# Patient Record
Sex: Female | Born: 1975 | Race: White | Hispanic: No | Marital: Married | Smoking: Never smoker
Health system: Southern US, Community
[De-identification: ages and names within clinical notes are randomized; demographics above are authoritative.]

## PROBLEM LIST (undated history)

## (undated) DIAGNOSIS — F988 Other specified behavioral and emotional disorders with onset usually occurring in childhood and adolescence: Secondary | ICD-10-CM

## (undated) DIAGNOSIS — D509 Iron deficiency anemia, unspecified: Secondary | ICD-10-CM

## (undated) HISTORY — DX: Iron deficiency anemia, unspecified: D50.9

## (undated) HISTORY — PX: BREAST SURGERY: SHX581

## (undated) HISTORY — PX: OTHER SURGICAL HISTORY: SHX169

---

## 2002-10-19 ENCOUNTER — Other Ambulatory Visit: Admission: RE | Admit: 2002-10-19 | Discharge: 2002-10-19 | Payer: Self-pay | Admitting: Obstetrics and Gynecology

## 2002-12-15 ENCOUNTER — Inpatient Hospital Stay (HOSPITAL_COMMUNITY): Admission: AD | Admit: 2002-12-15 | Discharge: 2002-12-16 | Payer: Self-pay | Admitting: Obstetrics and Gynecology

## 2003-07-29 ENCOUNTER — Inpatient Hospital Stay (HOSPITAL_COMMUNITY): Admission: AD | Admit: 2003-07-29 | Discharge: 2003-07-30 | Payer: Self-pay | Admitting: Obstetrics and Gynecology

## 2004-04-18 ENCOUNTER — Other Ambulatory Visit: Admission: RE | Admit: 2004-04-18 | Discharge: 2004-04-18 | Payer: Self-pay | Admitting: Obstetrics and Gynecology

## 2009-08-05 ENCOUNTER — Emergency Department (HOSPITAL_BASED_OUTPATIENT_CLINIC_OR_DEPARTMENT_OTHER): Admission: EM | Admit: 2009-08-05 | Discharge: 2009-08-05 | Payer: Self-pay | Admitting: Emergency Medicine

## 2010-02-04 ENCOUNTER — Ambulatory Visit: Payer: Self-pay | Admitting: Internal Medicine

## 2010-02-13 LAB — CBC & DIFF AND RETIC
Basophils Absolute: 0.1 10*3/uL (ref 0.0–0.1)
Eosinophils Absolute: 0.1 10*3/uL (ref 0.0–0.5)
HCT: 29.8 % — ABNORMAL LOW (ref 34.8–46.6)
HGB: 8.3 g/dL — ABNORMAL LOW (ref 11.6–15.9)
Immature Retic Fract: 11.3 % — ABNORMAL HIGH (ref 0.00–10.70)
LYMPH%: 27.6 % (ref 14.0–49.7)
MONO#: 0.5 10*3/uL (ref 0.1–0.9)
NEUT%: 62.7 % (ref 38.4–76.8)
Platelets: 268 10*3/uL (ref 145–400)
Retic Ct Abs: 30.22 10*3/uL (ref 18.30–72.70)
WBC: 7.2 10*3/uL (ref 3.9–10.3)
lymph#: 2 10*3/uL (ref 0.9–3.3)

## 2010-02-14 LAB — COMPREHENSIVE METABOLIC PANEL
ALT: 10 U/L (ref 0–35)
AST: 12 U/L (ref 0–37)
Albumin: 4.4 g/dL (ref 3.5–5.2)
CO2: 26 mEq/L (ref 19–32)
Calcium: 8.9 mg/dL (ref 8.4–10.5)
Chloride: 102 mEq/L (ref 96–112)
Creatinine, Ser: 0.74 mg/dL (ref 0.40–1.20)
Potassium: 3.9 mEq/L (ref 3.5–5.3)
Sodium: 138 mEq/L (ref 135–145)
Total Protein: 6.9 g/dL (ref 6.0–8.3)

## 2010-02-14 LAB — VITAMIN B12: Vitamin B-12: 307 pg/mL (ref 211–911)

## 2010-02-14 LAB — IRON AND TIBC: TIBC: 246 ug/dL — ABNORMAL LOW (ref 250–470)

## 2010-02-14 LAB — FOLATE RBC: RBC Folate: 824 ng/mL — ABNORMAL HIGH (ref 180–600)

## 2010-05-20 LAB — URINALYSIS, ROUTINE W REFLEX MICROSCOPIC
Hgb urine dipstick: NEGATIVE
Nitrite: NEGATIVE
Protein, ur: NEGATIVE mg/dL
Urobilinogen, UA: 0.2 mg/dL (ref 0.0–1.0)

## 2010-05-20 LAB — WET PREP, GENITAL
Trich, Wet Prep: NONE SEEN
Yeast Wet Prep HPF POC: NONE SEEN

## 2010-05-20 LAB — GC/CHLAMYDIA PROBE AMP, GENITAL: GC Probe Amp, Genital: NEGATIVE

## 2010-05-20 LAB — PREGNANCY, URINE: Preg Test, Ur: NEGATIVE

## 2010-05-20 LAB — HERPES SIMPLEX VIRUS CULTURE

## 2010-08-13 ENCOUNTER — Emergency Department (HOSPITAL_BASED_OUTPATIENT_CLINIC_OR_DEPARTMENT_OTHER)
Admission: EM | Admit: 2010-08-13 | Discharge: 2010-08-13 | Disposition: A | Discharge status: Home / Self Care | Payer: Medicaid Other | Attending: Emergency Medicine | Admitting: Emergency Medicine

## 2010-08-13 DIAGNOSIS — R109 Unspecified abdominal pain: Secondary | ICD-10-CM | POA: Insufficient documentation

## 2010-08-13 DIAGNOSIS — R112 Nausea with vomiting, unspecified: Secondary | ICD-10-CM | POA: Insufficient documentation

## 2010-08-13 LAB — URINALYSIS, ROUTINE W REFLEX MICROSCOPIC
Bilirubin Urine: NEGATIVE
Glucose, UA: NEGATIVE mg/dL
Hgb urine dipstick: NEGATIVE
Ketones, ur: NEGATIVE mg/dL
Leukocytes, UA: NEGATIVE
Protein, ur: NEGATIVE mg/dL
pH: 8.5 — ABNORMAL HIGH (ref 5.0–8.0)

## 2010-08-13 LAB — COMPREHENSIVE METABOLIC PANEL
Alkaline Phosphatase: 70 U/L (ref 39–117)
BUN: 17 mg/dL (ref 6–23)
CO2: 24 mEq/L (ref 19–32)
Chloride: 98 mEq/L (ref 96–112)
Creatinine, Ser: 0.6 mg/dL (ref 0.4–1.2)
GFR calc non Af Amer: >60 mL/min (ref >60)
Glucose, Bld: 97 mg/dL (ref 70–99)
Potassium: 3.7 mEq/L (ref 3.5–5.1)
Total Bilirubin: 0.7 mg/dL (ref 0.3–1.2)

## 2010-08-13 LAB — LIPASE, BLOOD: Lipase: 30 U/L (ref 11–59)

## 2010-08-13 LAB — DIFFERENTIAL
Basophils Relative: 0 % (ref 0–1)
Lymphs Abs: 0.5 10*3/uL — ABNORMAL LOW (ref 0.7–4.0)
Monocytes Absolute: 0.8 10*3/uL (ref 0.1–1.0)
Monocytes Relative: 5 % (ref 3–12)
Neutro Abs: 13.6 10*3/uL — ABNORMAL HIGH (ref 1.7–7.7)

## 2010-08-13 LAB — CBC
Hemoglobin: 12.9 g/dL (ref 12.0–15.0)
MCH: 25.5 pg — ABNORMAL LOW (ref 26.0–34.0)
MCHC: 32.7 g/dL (ref 30.0–36.0)
MCV: 78.2 fL (ref 78.0–100.0)
RBC: 5.05 MIL/uL (ref 3.87–5.11)

## 2010-11-27 ENCOUNTER — Emergency Department (INDEPENDENT_AMBULATORY_CARE_PROVIDER_SITE_OTHER): Payer: Self-pay

## 2010-11-27 ENCOUNTER — Encounter: Payer: Self-pay | Admitting: Family Medicine

## 2010-11-27 ENCOUNTER — Emergency Department (HOSPITAL_BASED_OUTPATIENT_CLINIC_OR_DEPARTMENT_OTHER)
Admission: EM | Admit: 2010-11-27 | Discharge: 2010-11-27 | Disposition: A | Discharge status: Home / Self Care | Payer: Self-pay | Attending: Emergency Medicine | Admitting: Emergency Medicine

## 2010-11-27 DIAGNOSIS — H6690 Otitis media, unspecified, unspecified ear: Secondary | ICD-10-CM

## 2010-11-27 DIAGNOSIS — F988 Other specified behavioral and emotional disorders with onset usually occurring in childhood and adolescence: Secondary | ICD-10-CM | POA: Insufficient documentation

## 2010-11-27 DIAGNOSIS — R0602 Shortness of breath: Secondary | ICD-10-CM | POA: Insufficient documentation

## 2010-11-27 DIAGNOSIS — J40 Bronchitis, not specified as acute or chronic: Secondary | ICD-10-CM

## 2010-11-27 DIAGNOSIS — R0789 Other chest pain: Secondary | ICD-10-CM

## 2010-11-27 DIAGNOSIS — R05 Cough: Secondary | ICD-10-CM

## 2010-11-27 DIAGNOSIS — Z79899 Other long term (current) drug therapy: Secondary | ICD-10-CM | POA: Insufficient documentation

## 2010-11-27 HISTORY — DX: Other specified behavioral and emotional disorders with onset usually occurring in childhood and adolescence: F98.8

## 2010-11-27 MED ORDER — AZITHROMYCIN 250 MG PO TABS: ORAL_TABLET | ORAL | Status: DC

## 2010-11-27 MED ORDER — ALBUTEROL SULFATE HFA 108 (90 BASE) MCG/ACT IN AERS
2.0000 | INHALATION_SPRAY | Freq: Once | RESPIRATORY_TRACT | Status: AC
  Administered 2010-11-27: 2 via RESPIRATORY_TRACT
  Filled 2010-11-27: qty 6.7

## 2010-11-27 NOTE — ED Provider Notes (Signed)
History     CSN: [id number] Arrival date & time: 11/27/2010  5:05 PM  Chief Complaint  Patient presents with  . Shortness of Breath    (Consider location/radiation/quality/duration/timing/severity/associated sxs/prior treatment) HPI Comments: The patient reports that 2 weeks ago she developed some nasal congestion and severe sore throat and was seen at the urgent care who performed a strep test that was negative. After examination she was diagnosed with a upper respiratory infection along with a ear infection and sinus infection. She was given a cephalosporin antibiotic for treatment. She is now had improvement of her sore throat but has developed chest heaviness as well as a paroxysmal dry cough. She denies any wheezing or vomiting. She is now also taking Tussionex for the cough, Sudafed for the sinus congestion and prednisone. She did subsequently develop some ear pain and now reports that hearing sounds muffled. She had been told that her left ear was infected at her first urgent care visit. She denies history of smoking, asthma, COPD, pneumonia, bronchitis.  Patient is a 35 y.o. female presenting with shortness of breath. The history is provided by the patient.  Shortness of Breath  Associated symptoms include chest pain, sore throat, cough and shortness of breath. Pertinent negatives include no rhinorrhea.    Past Medical History  Diagnosis Date  . Attention deficit disorder (ADD)     History reviewed. No pertinent past surgical history.  No family history on file.  History  Substance Use Topics  . Smoking status: Never Smoker   . Smokeless tobacco: Not on file  . Alcohol Use: No    OB History    Grav Para Term Preterm Abortions TAB SAB Ect Mult Living                  Review of Systems  Constitutional: Negative.   HENT: Positive for ear pain, sore throat and ear discharge. Negative for rhinorrhea, postnasal drip and sinus pressure.   Respiratory: Positive for cough  and shortness of breath.   Cardiovascular: Positive for chest pain.  Gastrointestinal: Negative for nausea, vomiting, abdominal pain and diarrhea.    Allergies  Review of patient's allergies indicates no known allergies.  Home Medications   Current Outpatient Rx  Name Route Sig Dispense Refill  . AMPHETAMINE-DEXTROAMPHETAMINE 20 MG PO TABS Oral Take 20 mg by mouth 2 (two) times daily.      Marland Kitchen HYDROCODONE-ACETAMINOPHEN 7.5-325 MG/15ML PO SOLN Oral Take 5 mLs by mouth at bedtime.      Marland Kitchen NAPROXEN SODIUM 220 MG PO TABS Oral Take 660 mg by mouth 2 (two) times daily with a meal.      . NORETHIN-ETH ESTRAD-FE BIPHAS 1 MG-10 MCG / 10 MCG PO TABS Oral Take 1 tablet by mouth daily.      Marland Kitchen PREDNISONE 20 MG PO TABS Oral Take 40 mg by mouth daily.      Marland Kitchen PSEUDOEPHEDRINE HCL 30 MG PO TABS Oral Take 30 mg by mouth every 4 (four) hours as needed. Allergies        BP 104/71  Pulse 80  Temp(Src) 98.4 F (36.9 C) (Oral)  Resp 18  Ht 5\' 6"  (1.676 m)  Wt 125 lb (56.7 kg)  BMI 20.18 kg/m2  SpO2 100%  Physical Exam  Constitutional: She appears well-developed and well-nourished. No distress.  HENT:  Head: Normocephalic and atraumatic.  Right Ear: External ear normal. No mastoid tenderness. Tympanic membrane is retracted. Tympanic membrane is not injected. No decreased hearing is noted.  Left Ear: External ear normal. No mastoid tenderness. Tympanic membrane is injected.  No middle ear effusion. Decreased hearing is noted.  Nose: Nose normal.  Mouth/Throat: Uvula is midline and oropharynx is clear and moist.  Cardiovascular: Normal rate.   Pulmonary/Chest: Effort normal and breath sounds normal. She has no wheezes. She has no rhonchi.    ED Course  Procedures (including critical care time)  Labs Reviewed - No data to display No results found.   No diagnosis found.    MDM  CXR is negative for acute, reviewed by myself.  PT likely has mild bronchitis.  Will change to a zpak to treat  possible otitis as well as acute respiratory bacterial bronchitis, although I doubt.  She is non smoker, normal RA sats here of 100% and no abn lung sounds and normal CXR.  Otherwise she can follow up with her own PCP.  She can otherwise continue her other home medications except the cephalosporin.          Gavin Pound. Sofia Jaquith, MD 11/27/10 1806

## 2010-11-27 NOTE — ED Notes (Signed)
Pt c/o feeling short of breath and "like I'm breathing through fluid". Pt reports low fever. Pt also c/o "chest feeling heavy and my ears are full". Pt able to speak in full sentences.

## 2010-11-27 NOTE — Discharge Instructions (Signed)
 I think it is safe to continue your current medications.  Use the inhaler 2 puffs every 6 hours as needed for chest tightness, shortness of breath, coughing or wheezing.  Change your antibiotics to the Z-Pak rather than your current antibiotic.  Follow up with your own physician next week.  Your chest xray was normal.

## 2010-11-27 NOTE — ED Notes (Signed)
Pt. Takes a deep breath with clear lung snds and no distress noted.

## 2011-01-22 ENCOUNTER — Other Ambulatory Visit: Payer: Self-pay | Admitting: *Deleted

## 2011-01-22 ENCOUNTER — Telehealth: Payer: Self-pay | Admitting: Internal Medicine

## 2011-01-22 DIAGNOSIS — D649 Anemia, unspecified: Secondary | ICD-10-CM

## 2011-01-22 NOTE — Telephone Encounter (Signed)
Talked to pt, she wants to schedule a Iron Treatment, informed pt that I will inform RN and we will call her

## 2011-01-22 NOTE — Progress Notes (Signed)
Pt called wanting to get an iron infusion, per Dr Donnald Garre, pt needs f/u with labwork.  Onc tx schedule filled out for iron study 2-3 days before MD visit in 1-2 weeks.  SLJ

## 2011-01-24 ENCOUNTER — Telehealth: Payer: Self-pay | Admitting: Internal Medicine

## 2011-01-24 NOTE — Telephone Encounter (Signed)
Called pt,left,message, regarding appt for 12/3 and 12/5th

## 2011-01-27 ENCOUNTER — Telehealth: Payer: Self-pay | Admitting: Internal Medicine

## 2011-01-27 NOTE — Telephone Encounter (Signed)
Pt called and requested appt sooner than next week . I left her a message stating that nest week was  the soonest she could be seen .

## 2011-02-03 ENCOUNTER — Other Ambulatory Visit (HOSPITAL_BASED_OUTPATIENT_CLINIC_OR_DEPARTMENT_OTHER): Payer: Self-pay | Admitting: Lab

## 2011-02-03 DIAGNOSIS — D649 Anemia, unspecified: Secondary | ICD-10-CM

## 2011-02-03 DIAGNOSIS — D509 Iron deficiency anemia, unspecified: Secondary | ICD-10-CM

## 2011-02-03 LAB — CBC WITH DIFFERENTIAL/PLATELET
BASO%: 0.6 % (ref 0.0–2.0)
EOS%: 0.9 % (ref 0.0–7.0)
HCT: 39.2 % (ref 34.8–46.6)
LYMPH%: 27.1 % (ref 14.0–49.7)
MCH: 26.2 pg (ref 25.1–34.0)
MCHC: 32.8 g/dL (ref 31.5–36.0)
MONO#: 0.5 10*3/uL (ref 0.1–0.9)
NEUT%: 64.9 % (ref 38.4–76.8)
RBC: 4.91 10*6/uL (ref 3.70–5.45)
WBC: 7 10*3/uL (ref 3.9–10.3)
lymph#: 1.9 10*3/uL (ref 0.9–3.3)

## 2011-02-03 LAB — IRON AND TIBC
%SAT: 12 % — ABNORMAL LOW (ref 20–55)
Iron: 65 ug/dL (ref 42–145)
TIBC: 549 ug/dL — ABNORMAL HIGH (ref 250–470)

## 2011-02-05 ENCOUNTER — Encounter: Payer: Self-pay | Admitting: Internal Medicine

## 2011-02-05 ENCOUNTER — Ambulatory Visit (HOSPITAL_BASED_OUTPATIENT_CLINIC_OR_DEPARTMENT_OTHER): Payer: Self-pay | Admitting: Internal Medicine

## 2011-02-05 VITALS — BP 124/76 | HR 85 | Temp 98.6°F | Ht 66.0 in | Wt 147.3 lb

## 2011-02-05 DIAGNOSIS — D509 Iron deficiency anemia, unspecified: Secondary | ICD-10-CM

## 2011-02-05 DIAGNOSIS — R5383 Other fatigue: Secondary | ICD-10-CM

## 2011-02-05 DIAGNOSIS — R21 Rash and other nonspecific skin eruption: Secondary | ICD-10-CM

## 2011-02-05 HISTORY — DX: Iron deficiency anemia, unspecified: D50.9

## 2011-02-05 NOTE — Progress Notes (Signed)
[facility] OFFICE PROGRESS NOTE  DIAGNOSIS: Iron deficiency anemia  PRIOR THERAPY: Feraheme infusion x1 given 02/14/2010.   CURRENT THERAPY: None  INTERVAL HISTORY: Sara Beltran 35 y.o. female returns to the clinic today for followup visit. The patient was seen previously on 02/13/2010 for evaluation of iron deficiency anemia. Her iron level was very low at that time. I recommended for the patient Feraheme infusion for 2 doses with followup in 2 months. She received only 1 dose, which is a significant improvement in her hemoglobin and hematocrit, but the patient was lost to followup since that time. Over the last few weeks, she has not been feeling well with a skin rash on her face as well as significant fatigue and weakness. She lost her job recently because of lack of concentration. The patient came today for evaluation and recommendation regarding her iron deficiency anemia. Repeat study on 02/03/2011 showed highly than was 65 with a saturation of 12%. Her ferritin level is low at 4. The patient denied having any significant weight loss or night sweats, has no dizzy spell, no chest pain or shortness of breath.  MEDICAL HISTORY: Past Medical History  Diagnosis Date  . Attention deficit disorder (ADD)   . Iron deficiency anemia 02/05/2011    ALLERGIES:   has no known allergies.  MEDICATIONS:  Current Outpatient Prescriptions  Medication Sig Dispense Refill  . amphetamine-dextroamphetamine (ADDERALL) 20 MG tablet Take 20 mg by mouth 2 (two) times daily.        . Norethindrone-Ethinyl Estradiol-Fe Biphas (LO LOESTRIN FE) 1 MG-10 MCG / 10 MCG tablet Take 1 tablet by mouth daily.        Marland Kitchen azithromycin (ZITHROMAX Z-PAK) 250 MG tablet Take 2 tablets PO on first day, then 1 tablet PO on days 2-5  6 tablet  0  . hydrocodone-acetaminophen (HYCET) 7.5-325 MG/15ML solution Take 5 mLs by mouth at bedtime.        . naproxen sodium (ANAPROX) 220 MG tablet Take 660 mg by mouth 2 (two)  times daily with a meal.        . predniSONE (DELTASONE) 20 MG tablet Take 40 mg by mouth daily.        . pseudoephedrine (SUDAFED) 30 MG tablet Take 30 mg by mouth every 4 (four) hours as needed. Allergies          REVIEW OF SYSTEMS:  A comprehensive review of systems was negative except for: Constitutional: positive for fatigue   PHYSICAL EXAMINATION: General appearance: alert, cooperative and no distress Head: Normocephalic, without obvious abnormality, atraumatic Neck: no adenopathy Lymph nodes: Cervical, supraclavicular, and axillary nodes normal. Resp: clear to auscultation bilaterally Cardio: regular rate and rhythm, S1, S2 normal, no murmur, click, rub or gallop GI: soft, non-tender; bowel sounds normal; no masses,  no organomegaly Extremities: extremities normal, atraumatic, no cyanosis or edema Neurologic: Alert and oriented X 3, normal strength and tone. Normal symmetric reflexes. Normal coordination and gait  ECOG PERFORMANCE STATUS: 1 - Symptomatic but completely ambulatory  Blood pressure 124/76, pulse 85, temperature 98.6 F (37 C), temperature source Oral, height 5\' 6"  (1.676 m), weight 147 lb 4.8 oz (66.815 kg).  LABORATORY DATA: Lab Results  Component Value Date   WBC 7.0 02/03/2011   HGB 12.9 02/03/2011   HCT 39.2 02/03/2011   MCV 79.9 02/03/2011   PLT 325 02/03/2011      Chemistry      Component Value Date/Time   NA 134* 08/13/2010 1506   K  3.7 08/13/2010 1506   CL 98 08/13/2010 1506   CO2 24 08/13/2010 1506   BUN 17 08/13/2010 1506   CREATININE 0.60 08/13/2010 1506      Component Value Date/Time   CALCIUM 9.3 08/13/2010 1506   ALKPHOS 70 08/13/2010 1506   AST 20 08/13/2010 1506   ALT 24 08/13/2010 1506   BILITOT 0.7 08/13/2010 1506      ASSESSMENT: This is a very pleasant 35 years old white female with iron deficiency. The patient was not compliant with her treatment secondary to lack of insurance coverage. I discussed the lab result with Sara Beltran  today.  PLAN: I recommended for her to resume treatment was iron infusion in the form of Feraheme. I will schedule her next dose next week. In the meantime I started the patient on Integra plus 1 capsule by mouth daily. She was given a sample from the clinic equivalent for 16 days. The patient would come back for followup visit in 2 months for reevaluation with repeat CBC and iron study.   All questions were answered. The patient knows to call the clinic with any problems, questions or concerns. We can certainly see the patient much sooner if necessary.

## 2011-02-11 ENCOUNTER — Ambulatory Visit (HOSPITAL_BASED_OUTPATIENT_CLINIC_OR_DEPARTMENT_OTHER): Payer: Self-pay

## 2011-02-11 ENCOUNTER — Other Ambulatory Visit: Payer: Self-pay | Admitting: *Deleted

## 2011-02-11 VITALS — BP 108/78 | HR 75 | Temp 97.8°F

## 2011-02-11 DIAGNOSIS — D509 Iron deficiency anemia, unspecified: Secondary | ICD-10-CM

## 2011-02-11 MED ORDER — SODIUM CHLORIDE 0.9 % IV SOLN
Freq: Once | INTRAVENOUS | Status: AC
  Administered 2011-02-11: 10:00:00 via INTRAVENOUS

## 2011-02-11 MED ORDER — SODIUM CHLORIDE 0.9 % IV SOLN
1020.0000 mg | Freq: Once | INTRAVENOUS | Status: AC
  Administered 2011-02-11: 1020 mg via INTRAVENOUS
  Filled 2011-02-11: qty 34

## 2011-02-12 ENCOUNTER — Ambulatory Visit: Payer: Self-pay

## 2011-02-13 ENCOUNTER — Ambulatory Visit: Payer: Self-pay

## 2011-02-14 ENCOUNTER — Ambulatory Visit: Payer: Self-pay

## 2011-02-18 ENCOUNTER — Ambulatory Visit (HOSPITAL_BASED_OUTPATIENT_CLINIC_OR_DEPARTMENT_OTHER): Payer: Self-pay

## 2011-02-18 VITALS — BP 114/65 | HR 83 | Temp 97.8°F

## 2011-02-18 DIAGNOSIS — D509 Iron deficiency anemia, unspecified: Secondary | ICD-10-CM

## 2011-02-18 MED ORDER — SODIUM CHLORIDE 0.9 % IV SOLN
1020.0000 mg | Freq: Once | INTRAVENOUS | Status: AC
  Administered 2011-02-18: 1020 mg via INTRAVENOUS
  Filled 2011-02-18: qty 34

## 2011-02-18 MED ORDER — SODIUM CHLORIDE 0.9 % IV SOLN
Freq: Once | INTRAVENOUS | Status: AC
  Administered 2011-02-18: 14:00:00 via INTRAVENOUS

## 2011-02-21 ENCOUNTER — Telehealth: Payer: Self-pay | Admitting: Internal Medicine

## 2011-02-21 NOTE — Telephone Encounter (Addendum)
I could not contact pt to tell her that her IV iron  appt is cancelled on Monday per pharmacy. I left a message with her father to have her call me.  1630-I contacted pt and told her to cancel her iron appointments 12/24 and first week in jan . I told her someone will call her with f/u appointment for jan. Pt voices understanding.

## 2011-02-24 ENCOUNTER — Other Ambulatory Visit: Payer: Self-pay | Admitting: *Deleted

## 2011-02-24 ENCOUNTER — Ambulatory Visit: Payer: Self-pay

## 2011-03-04 ENCOUNTER — Other Ambulatory Visit: Payer: Self-pay | Admitting: Internal Medicine

## 2011-03-05 ENCOUNTER — Ambulatory Visit: Payer: Self-pay

## 2011-03-10 ENCOUNTER — Telehealth: Payer: Self-pay | Admitting: Internal Medicine

## 2011-03-10 ENCOUNTER — Encounter: Payer: Self-pay | Admitting: Internal Medicine

## 2011-03-10 NOTE — Telephone Encounter (Signed)
Tried Recruitment consultant and work number. Voice mail full and I did not recognize name on work number. I tried to contact pt about her low energy and tell her she had the two doses Dr Donnald Garre ordered. I contacted her  Father and asked him to have her call me.

## 2011-03-11 ENCOUNTER — Telehealth: Payer: Self-pay | Admitting: Internal Medicine

## 2011-03-11 NOTE — Telephone Encounter (Signed)
Spoke to patient. I told her she had 2 doses of IV iron in December . She said she is still very tired  despite taking her Adderall. She says she is "worn out"  after taking her daughter to school .  She admits she has not taken her Integra as ordered. I told her she needs to take her Integra everyday and she said she would. She confirmed next appointment with Dr Donnald Garre.  She understands to call for worsening symptoms.

## 2011-04-08 ENCOUNTER — Ambulatory Visit (HOSPITAL_BASED_OUTPATIENT_CLINIC_OR_DEPARTMENT_OTHER): Payer: Self-pay | Admitting: Internal Medicine

## 2011-04-08 ENCOUNTER — Other Ambulatory Visit (HOSPITAL_BASED_OUTPATIENT_CLINIC_OR_DEPARTMENT_OTHER): Payer: Self-pay

## 2011-04-08 ENCOUNTER — Ambulatory Visit: Payer: Self-pay

## 2011-04-08 ENCOUNTER — Telehealth: Payer: Self-pay | Admitting: Internal Medicine

## 2011-04-08 VITALS — BP 117/70 | HR 76 | Temp 99.4°F | Ht 66.0 in | Wt 143.1 lb

## 2011-04-08 DIAGNOSIS — D509 Iron deficiency anemia, unspecified: Secondary | ICD-10-CM

## 2011-04-08 DIAGNOSIS — N390 Urinary tract infection, site not specified: Secondary | ICD-10-CM

## 2011-04-08 DIAGNOSIS — D649 Anemia, unspecified: Secondary | ICD-10-CM

## 2011-04-08 LAB — CBC WITH DIFFERENTIAL/PLATELET
Basophils Absolute: 0 10*3/uL (ref 0.0–0.1)
Eosinophils Absolute: 0.1 10*3/uL (ref 0.0–0.5)
HGB: 14.1 g/dL (ref 11.6–15.9)
MONO#: 0.4 10*3/uL (ref 0.1–0.9)
NEUT#: 5.6 10*3/uL (ref 1.5–6.5)
RBC: 4.61 10*6/uL (ref 3.70–5.45)
RDW: 18.2 % — ABNORMAL HIGH (ref 11.2–14.5)
WBC: 7.5 10*3/uL (ref 3.9–10.3)

## 2011-04-08 LAB — FERRITIN: Ferritin: 389 ng/mL — ABNORMAL HIGH (ref 10–291)

## 2011-04-08 LAB — COMPREHENSIVE METABOLIC PANEL
Albumin: 4 g/dL (ref 3.5–5.2)
BUN: 14 mg/dL (ref 6–23)
CO2: 27 mEq/L (ref 19–32)
Calcium: 9.2 mg/dL (ref 8.4–10.5)
Chloride: 101 mEq/L (ref 96–112)
Glucose, Bld: 101 mg/dL — ABNORMAL HIGH (ref 70–99)
Potassium: 3.3 mEq/L — ABNORMAL LOW (ref 3.5–5.3)
Sodium: 139 mEq/L (ref 135–145)
Total Protein: 7.1 g/dL (ref 6.0–8.3)

## 2011-04-08 LAB — IRON AND TIBC
TIBC: 345 ug/dL (ref 250–470)
UIBC: 213 ug/dL (ref 125–400)

## 2011-04-08 MED ORDER — CIPROFLOXACIN HCL 500 MG PO TABS: 500.0000 mg | ORAL_TABLET | Freq: Two times a day (BID) | ORAL | Status: AC

## 2011-04-08 NOTE — Progress Notes (Signed)
[facility] OFFICE PROGRESS NOTE  DIAGNOSIS: Iron deficiency anemia   PRIOR THERAPY: Feraheme infusion x3, last dose was given 02/18/2011.   CURRENT THERAPY: None   INTERVAL HISTORY: Sara Beltran 36 y.o. female returns to the clinic today for followup visit. The patient tolerated the last 2 intravenous infusion of Feraheme fairly well. She has improvement in her general condition, but still had some mild fatigue. She denied having any significant weight loss or night sweats. She has no chest pain or shortness of breath. She complained today of bad odor of her urine as well as inability to empty her bladder. She has repeat CBC on a study performed earlier today and she is here for evaluation and discussion of her lab results.  MEDICAL HISTORY: Past Medical History  Diagnosis Date  . Attention deficit disorder (ADD)   . Iron deficiency anemia 02/05/2011    ALLERGIES:   has no known allergies.  MEDICATIONS:  Current Outpatient Prescriptions  Medication Sig Dispense Refill  . amphetamine-dextroamphetamine (ADDERALL) 20 MG tablet Take 20 mg by mouth 2 (two) times daily.        . Norethindrone-Ethinyl Estradiol-Fe Biphas (LO LOESTRIN FE) 1 MG-10 MCG / 10 MCG tablet Take 1 tablet by mouth daily.        Marland Kitchen azithromycin (ZITHROMAX Z-PAK) 250 MG tablet Take 2 tablets PO on first day, then 1 tablet PO on days 2-5  6 tablet  0  . ciprofloxacin (CIPRO) 500 MG tablet Take 1 tablet (500 mg total) by mouth 2 (two) times daily.  10 tablet  0  . hydrocodone-acetaminophen (HYCET) 7.5-325 MG/15ML solution Take 5 mLs by mouth at bedtime.        . naproxen sodium (ANAPROX) 220 MG tablet Take 660 mg by mouth 2 (two) times daily with a meal.        . predniSONE (DELTASONE) 20 MG tablet Take 40 mg by mouth daily.        . pseudoephedrine (SUDAFED) 30 MG tablet Take 30 mg by mouth every 4 (four) hours as needed. Allergies          REVIEW OF SYSTEMS:  A comprehensive review of systems  was negative except for: Genitourinary: positive for decreased stream, hesitancy and bad odor   PHYSICAL EXAMINATION: General appearance: alert, cooperative and no distress Lymph nodes: Cervical, supraclavicular, and axillary nodes normal. Resp: clear to auscultation bilaterally Cardio: regular rate and rhythm, S1, S2 normal, no murmur, click, rub or gallop GI: soft, non-tender; bowel sounds normal; no masses,  no organomegaly Extremities: extremities normal, atraumatic, no cyanosis or edema  ECOG PERFORMANCE STATUS: 0 - Asymptomatic  Blood pressure 117/70, pulse 76, temperature 99.4 F (37.4 C), temperature source Oral, height 5\' 6"  (1.676 m), weight 143 lb 1.6 oz (64.91 kg).  LABORATORY DATA: Lab Results  Component Value Date   WBC 7.5 04/08/2011   HGB 14.1 04/08/2011   HCT 41.0 04/08/2011   MCV 88.8 04/08/2011   PLT 268 04/08/2011      Chemistry      Component Value Date/Time   NA 139 04/08/2011 1500   K 3.3* 04/08/2011 1500   CL 101 04/08/2011 1500   CO2 27 04/08/2011 1500   BUN 14 04/08/2011 1500   CREATININE 0.87 04/08/2011 1500      Component Value Date/Time   CALCIUM 9.2 04/08/2011 1500   ALKPHOS 48 04/08/2011 1500   AST 14 04/08/2011 1500   ALT 16 04/08/2011 1500  BILITOT 0.7 04/08/2011 1500       RADIOGRAPHIC STUDIES: No results found.  ASSESSMENT: This is a very pleasant 36 years old white female with iron deficiency anemia status post Feraheme infusion with significant improvement in her hemoglobin and hematocrit. The iron study still pending. She also has symptoms of urinary tract infection. I discussed the lab result with the patient today.  PLAN:  #1 I will continue the patient on observation for now with repeat CBC and a study in 3 months for evaluation of her iron deficiency anemia. #2 I will check urinalysis today with culture sensitivity. I started the patient empirically on Cipro 500 mg by mouth twice a day for 5 days. The patient was advised to call me immediately if she has  any concerning symptoms in the interval.   All questions were answered. The patient knows to call the clinic with any problems, questions or concerns. We can certainly see the patient much sooner if necessary.

## 2011-04-08 NOTE — Telephone Encounter (Signed)
sent pt to labs.  gv appt for [id number]

## 2011-04-10 LAB — URINE CULTURE

## 2011-04-21 ENCOUNTER — Telehealth: Payer: Self-pay

## 2011-04-21 NOTE — Telephone Encounter (Signed)
.  [facility] PT IN NEED OF HER ADDERALL. PLEASE CALL [phone] WHEN READY FOR P/U

## 2011-04-22 ENCOUNTER — Other Ambulatory Visit: Payer: Self-pay | Admitting: Physician Assistant

## 2011-04-22 MED ORDER — AMPHETAMINE-DEXTROAMPHETAMINE 20 MG PO TABS: 20.0000 mg | ORAL_TABLET | Freq: Two times a day (BID) | ORAL | Status: DC

## 2011-04-23 ENCOUNTER — Telehealth: Payer: Self-pay

## 2011-04-23 NOTE — Telephone Encounter (Signed)
LMOM that Adderall Rx is ready for p/up

## 2011-05-23 ENCOUNTER — Telehealth: Payer: Self-pay

## 2011-05-23 NOTE — Telephone Encounter (Signed)
PT IS REQUESTING ADDERALL 20 MG 2 TIMES DAILY  CALL [phone]

## 2011-05-24 MED ORDER — AMPHETAMINE-DEXTROAMPHETAMINE 20 MG PO TABS: 20.0000 mg | ORAL_TABLET | Freq: Two times a day (BID) | ORAL | Status: DC

## 2011-05-24 NOTE — Telephone Encounter (Signed)
Singed at [employer] - will need ov next month before more RX are written.

## 2011-05-24 NOTE — Telephone Encounter (Signed)
ADVISED PT THAT RX IS READY FOR PICKUP 

## 2011-06-24 ENCOUNTER — Telehealth: Payer: Self-pay

## 2011-06-24 MED ORDER — AMPHETAMINE-DEXTROAMPHETAMINE 20 MG PO TABS: 20.0000 mg | ORAL_TABLET | Freq: Two times a day (BID) | ORAL | Status: DC

## 2011-06-24 NOTE — Telephone Encounter (Signed)
Chart pulled to PA 

## 2011-06-24 NOTE — Telephone Encounter (Signed)
Please pull chart.  Sara Beltran 

## 2011-06-24 NOTE — Telephone Encounter (Signed)
PT WOULD LIKE REFILL ON ADDERALL 20MG  2X DAILY.  CALL WHEN READY FOR PICKUP

## 2011-06-24 NOTE — Telephone Encounter (Signed)
Done and ready for pick up.  Sara Beltran

## 2011-06-24 NOTE — Telephone Encounter (Signed)
Spoke with pt advised RX ready to be picked up. 

## 2011-07-08 ENCOUNTER — Other Ambulatory Visit: Payer: Self-pay | Admitting: Lab

## 2011-07-08 ENCOUNTER — Ambulatory Visit: Payer: Self-pay | Admitting: Internal Medicine

## 2011-07-29 ENCOUNTER — Telehealth: Payer: Self-pay

## 2011-07-29 MED ORDER — AMPHETAMINE-DEXTROAMPHETAMINE 20 MG PO TABS: 20.0000 mg | ORAL_TABLET | Freq: Two times a day (BID) | ORAL | Status: DC

## 2011-07-29 NOTE — Telephone Encounter (Signed)
PT REQUESTING ADDERALL REFILL  BEST PHONE [phone]

## 2011-07-29 NOTE — Telephone Encounter (Signed)
Addended by: Fernande Bras on: 07/29/2011 09:24 PM   Modules accepted: Orders

## 2011-07-29 NOTE — Telephone Encounter (Signed)
Rx printed.  Please advise patient that she is overdue for follow-up.  She needs an OV for her next fill.

## 2011-07-30 NOTE — Telephone Encounter (Signed)
LMOM to call back

## 2011-07-30 NOTE — Telephone Encounter (Signed)
Rx is in drawer for p/up. Please let pt know about need for OV

## 2011-07-31 NOTE — Telephone Encounter (Signed)
Notified pt of Rx and need for OV. Pt agreed

## 2011-10-14 ENCOUNTER — Ambulatory Visit: Payer: Self-pay | Admitting: Family Medicine

## 2011-10-14 VITALS — BP 118/76 | HR 88 | Temp 98.5°F | Resp 16 | Ht 65.5 in | Wt 141.8 lb

## 2011-10-14 DIAGNOSIS — F909 Attention-deficit hyperactivity disorder, unspecified type: Secondary | ICD-10-CM

## 2011-10-14 DIAGNOSIS — N760 Acute vaginitis: Secondary | ICD-10-CM

## 2011-10-14 LAB — POCT WET PREP WITH KOH
KOH Prep POC: NEGATIVE
Trichomonas, UA: NEGATIVE

## 2011-10-14 MED ORDER — AMPHETAMINE-DEXTROAMPHETAMINE 20 MG PO TABS: 20.0000 mg | ORAL_TABLET | Freq: Two times a day (BID) | ORAL | Status: DC

## 2011-10-14 MED ORDER — METRONIDAZOLE 500 MG PO TABS: 500.0000 mg | ORAL_TABLET | Freq: Two times a day (BID) | ORAL | Status: AC

## 2011-10-14 MED ORDER — NORETHIN-ETH ESTRAD-FE BIPHAS 1 MG-10 MCG / 10 MCG PO TABS: 1.0000 | ORAL_TABLET | Freq: Every day | ORAL | Status: DC

## 2011-10-14 MED ORDER — FLUCONAZOLE 150 MG PO TABS: 150.0000 mg | ORAL_TABLET | Freq: Once | ORAL | Status: AC

## 2011-10-14 NOTE — Patient Instructions (Addendum)
1. Vaginitis and vulvovaginitis  POCT Wet Prep with KOH, fluconazole (DIFLUCAN) 150 MG tablet  2. ADHD (attention deficit hyperactivity disorder)  amphetamine-dextroamphetamine (ADDERALL) 20 MG tablet     Take Diflucan today; if still having vaginal itching in one week, recommend filling Metronidazole for bacterial vaginitis.  Refill of Adderall and birth control provided at visit.  Will need repeat visit/follow up visit for ADD in six months.

## 2011-10-14 NOTE — Progress Notes (Signed)
Subjective:    Patient ID: Sara Beltran, female    DOB: May 10, 1975, 36 y.o.   MRN: [id number]  HPIThis 36 y.o. female presents for evaluation of yeast infection/vaginal itching.  Onset with vaginal itching for one day.  No vaginal discharge.  No redness; no sores in vaginal area.  No dysuria. +sexual activity; no new partners; current partner x 3 months.  HSV-I outbreak.  Previous STD screening; followed by Oncology/Hematology center for anemia.  Last pap smear 11/2010 normal.  Finished Amoxicillin yesterday for UTI symptoms which has improved.  No fever/chills/sweats.  No urgency or nocturia.  ADHD:  Due for refill for ADD.  Medication working well.  Will split pills and adjust dose.  Will take 1/2 in am and 1/2 four hours later.  XR did not work so splitting pills works best; current dose for two years.  Previously took 30mg  in past; since at this location, taking 20mg  tablets. 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  No side effects to medication; some insomnia intermittently; no heart palpitations; no headaches.   Last phone message requesting refill 07/31/11; OV required for further refills.  Last OV for ADD follow-up 11/2010.  Currently self pay; not taking medication daily due to expense.  Diagnosed with ADHD in college.     Past Medical History  Diagnosis Date  . Attention deficit disorder (ADD)   . Iron deficiency anemia 02/05/2011    No past surgical history on file.  Prior to Admission medications   Medication Sig Start Date End Date Taking? Authorizing Provider  amphetamine-dextroamphetamine (ADDERALL) 20 MG tablet Take 1 tablet (20 mg total) by mouth 2 (two) times daily. 07/29/11  Yes Chelle S Jeffery, PA-C  Norethindrone-Ethinyl Estradiol-Fe Biphas (LO LOESTRIN FE) 1 MG-10 MCG / 10 MCG tablet Take 1 tablet by mouth daily.     Yes Historical Provider, MD    No Known Allergies  History   Social History  . Marital Status: Divorced    Spouse Name: N/A   Number of Children: N/A  . Years of Education: N/A   Occupational History  . Not on file.   Social History Main Topics  . Smoking status: Never Smoker   . Smokeless tobacco: Not on file  . Alcohol Use: No  . Drug Use: No  . Sexually Active:    Other Topics Concern  . Not on file   Social History Narrative  . No narrative on file    No family history on file.    Review of Systems  Constitutional: Negative for fever, diaphoresis and fatigue.  Respiratory: Negative for shortness of breath and wheezing.   Cardiovascular: Negative for chest pain and palpitations.  Gastrointestinal: Negative for nausea, vomiting, abdominal pain and abdominal distention.  Genitourinary: Negative for dysuria, urgency, hematuria, flank pain, vaginal bleeding, vaginal discharge, genital sores, vaginal pain, menstrual problem and pelvic pain.       +VAGINAL ITCHING WITHOUT VAGINAL DISCHARGE; NO VAGINAL LESIONS.  Psychiatric/Behavioral: Positive for decreased concentration. Negative for hallucinations, behavioral problems, confusion, disturbed wake/sleep cycle, self-injury, dysphoric mood and agitation. The patient is not nervous/anxious.        Objective:   Physical Exam  Nursing note and vitals reviewed. Constitutional: She is oriented to person, place, and time. She appears well-developed and well-nourished.  Eyes: Conjunctivae are normal. Pupils are equal, round, and reactive to light.  Neck: Normal range of motion. Neck supple. No thyromegaly present.  Cardiovascular: Normal rate, regular rhythm and normal heart sounds.  Exam reveals no gallop and no friction rub.   No murmur heard. Pulmonary/Chest: Effort normal and breath sounds normal.  Abdominal: Soft. Bowel sounds are normal. She exhibits no distension and no mass. There is no tenderness. There is no rebound and no guarding.  Genitourinary: Uterus normal. Vaginal discharge found.       +SCANT VAGINAL DISCHARGE YELLOW-WHITE PRESENT IN  VAULT; NO CMT; NO ADNEXAL MASSES.  Neurological: She is alert and oriented to person, place, and time. No cranial nerve deficit. She exhibits normal muscle tone. Coordination normal.  Psychiatric: She has a normal mood and affect. Her behavior is normal. Judgment and thought content normal.   GU: scant white yellow vaginal discharge; mild vaginal erythema.  NO CMT.  Normal cervix without lesions.  Results for orders placed in visit on 10/14/11  POCT WET PREP WITH KOH      Component Value Range   Trichomonas, UA Negative     Clue Cells Wet Prep HPF POC 100%     Epithelial Wet Prep HPF POC 3-5     Yeast Wet Prep HPF POC neg     Bacteria Wet Prep HPF POC 1+     RBC Wet Prep HPF POC 0-2     WBC Wet Prep HPF POC 2-5     KOH Prep POC Negative            Assessment & Plan:    1. Vaginitis and vulvovaginitis  POCT Wet Prep with KOH, fluconazole (DIFLUCAN) 150 MG tablet, metroNIDAZOLE (FLAGYL) 500 MG tablet  2. ADHD (attention deficit hyperactivity disorder)  amphetamine-dextroamphetamine (ADDERALL) 20 MG tablet   1.  Vaginitis:  New. Onset 24 hours ago after completing Amoxicillin therapy. Wet prep negative for yeast.  Will empirically treat with Diflucan; if no improvement in one week after taking Diflucan, rx for Metronidazole provided to take for BV. Pt expressed understanding of instructions.  Declined STD screening today.  2. ADD: stable; refill x 1 month provided of Adderall 20mg  bid; advised of policy to call monthly for refills; not followed at clinic by regular PCP.  No change in dose.  Recommend follow-up in six months due to lack of insurance.  3. Contraception management: pt requested refill of OCP at end of visit and provided RF.

## 2011-10-24 NOTE — Progress Notes (Signed)
Reviewed and agree.

## 2011-11-17 ENCOUNTER — Telehealth: Payer: Self-pay

## 2011-11-17 DIAGNOSIS — F909 Attention-deficit hyperactivity disorder, unspecified type: Secondary | ICD-10-CM

## 2011-11-17 NOTE — Telephone Encounter (Signed)
Ok x 5 months  

## 2011-11-17 NOTE — Telephone Encounter (Signed)
REQUESTING REFILL ON amphetamine-dextroamphetamine (ADDERALL) 20 MG tablet  CALL [phone]

## 2011-11-18 MED ORDER — AMPHETAMINE-DEXTROAMPHETAMINE 20 MG PO TABS: 20.0000 mg | ORAL_TABLET | Freq: Two times a day (BID) | ORAL | Status: DC

## 2011-11-18 NOTE — Telephone Encounter (Signed)
Called patient to advise.  Left message

## 2011-11-18 NOTE — Telephone Encounter (Signed)
Medication refilled and printed.  

## 2011-12-20 ENCOUNTER — Other Ambulatory Visit: Payer: Self-pay | Admitting: Physician Assistant

## 2011-12-20 NOTE — Telephone Encounter (Signed)
Please see phone message attached to refill request. Please submit Adderall refill request in correct location so we have documentation. Please call pharmacy about cheaper OCP.

## 2011-12-20 NOTE — Telephone Encounter (Signed)
Pt states ago she was in office and went to pharmacy to try and pick up her birth control and pharmacy states she needs to contact us to change her bc to a cheaper brand microgetine. Please contact pharmacy if any questions. Pt [phone] pt also needs rx refill for adderall please call when ready for pick -up

## 2011-12-21 ENCOUNTER — Telehealth: Payer: Self-pay | Admitting: *Deleted

## 2011-12-21 DIAGNOSIS — F909 Attention-deficit hyperactivity disorder, unspecified type: Secondary | ICD-10-CM

## 2011-12-21 MED ORDER — AMPHETAMINE-DEXTROAMPHETAMINE 20 MG PO TABS: 20.0000 mg | ORAL_TABLET | Freq: Two times a day (BID) | ORAL | Status: DC

## 2011-12-21 NOTE — Telephone Encounter (Signed)
Sara Beltran 12/20/2011 1:34 PM Signed  Please see phone message attached to refill request. Please submit Adderall refill request in correct location so we have documentation. Please call pharmacy about cheaper OCP.  Lauretta Grill 12/20/2011 1:16 PM Signed  Pt states ago she was in office and went to pharmacy to try and pick up her birth control and pharmacy states she needs to contact us to change her bc to a cheaper brand microgetine. Please contact pharmacy if any questions. Pt [phone] pt also needs rx refill for adderall please call when ready for pick -up

## 2011-12-21 NOTE — Telephone Encounter (Signed)
Pt notified that rx is ready for pickup and that other rx was sent in

## 2011-12-21 NOTE — Telephone Encounter (Signed)
Adderall Rx printed and at the TL desk.

## 2012-01-20 ENCOUNTER — Telehealth: Payer: Self-pay

## 2012-01-20 DIAGNOSIS — F909 Attention-deficit hyperactivity disorder, unspecified type: Secondary | ICD-10-CM

## 2012-01-20 MED ORDER — AMPHETAMINE-DEXTROAMPHETAMINE 20 MG PO TABS: 20.0000 mg | ORAL_TABLET | Freq: Two times a day (BID) | ORAL | Status: DC

## 2012-01-20 NOTE — Telephone Encounter (Signed)
Refilled

## 2012-01-20 NOTE — Telephone Encounter (Signed)
Spoke with pt advised  Rx ready to pick up. 

## 2012-01-20 NOTE — Telephone Encounter (Signed)
Patient request Adderall rx refill. [phone].

## 2012-02-17 ENCOUNTER — Telehealth: Payer: Self-pay

## 2012-02-17 DIAGNOSIS — F909 Attention-deficit hyperactivity disorder, unspecified type: Secondary | ICD-10-CM

## 2012-02-17 NOTE — Telephone Encounter (Signed)
Pt is requesting a refill on adderall. States may be a little early, but she is going out of town

## 2012-02-19 MED ORDER — AMPHETAMINE-DEXTROAMPHETAMINE 20 MG PO TABS: 20.0000 mg | ORAL_TABLET | Freq: Two times a day (BID) | ORAL | Status: DC

## 2012-02-19 NOTE — Telephone Encounter (Signed)
Rx ready for pick up.  Please remind patient that she will be due for office follow-up in 04/2012.  KMS

## 2012-02-19 NOTE — Telephone Encounter (Signed)
Called patient to advise  °

## 2012-03-27 ENCOUNTER — Telehealth: Payer: Self-pay

## 2012-03-27 NOTE — Telephone Encounter (Signed)
PATIENT WOULD LIKE ADDERALL RX. BEST # [phone]

## 2012-03-29 ENCOUNTER — Other Ambulatory Visit: Payer: Self-pay

## 2012-03-29 DIAGNOSIS — F909 Attention-deficit hyperactivity disorder, unspecified type: Secondary | ICD-10-CM

## 2012-03-29 NOTE — Telephone Encounter (Signed)
Pt is requesting a refill on adderall. States may be a little early, but she is going out of town. PATIENT NEEDS NIECE TO PICK IT UP FOR HER: JODIE BRYANT. [phone].

## 2012-03-30 MED ORDER — AMPHETAMINE-DEXTROAMPHETAMINE 20 MG PO TABS: 20.0000 mg | ORAL_TABLET | Freq: Two times a day (BID) | ORAL | Status: DC

## 2012-03-30 NOTE — Telephone Encounter (Signed)
Call --- refill for Adderall ready for pick up.  Please advise pt that she will need appointment in 04/2012 for further refills; last office visit six months ago in 10/2011.  Also please advise pt of new controlled substance policy: [facility] Policy for Prescribing Controlled Substances (Revised 01/2012) 1. Prescriptions for controlled substances will be filled by ONE provider at [facility] with whom you have established and developed a plan for your care, including follow-up. 2. You are encouraged to schedule an appointment with your prescriber at our appointment center for follow-up visits whenever possible. 3. If you request a prescription for the controlled substance while at [facility] for an acute problem (with someone other than your regular prescriber), you MAY be given a ONE-TIME prescription for a 30-day supply of the controlled substance, to allow time for you to return to see your regular prescriber for additional prescriptions.  Patient will need to choose one provider to see every six months for refills of Adderall.  We also advise she make appointment at 104 for six month follow-ups.  If she does not have a specific provider, advise she make appointment with me in one month at 104 by appointment.  Thanks, [employer]

## 2012-03-30 NOTE — Telephone Encounter (Signed)
Please advise on Adderall Rx, pended Rx.

## 2012-03-30 NOTE — Addendum Note (Signed)
Addended byCaffie Damme on: 03/30/2012 09:22 AM   Modules accepted: Orders

## 2012-03-30 NOTE — Telephone Encounter (Signed)
PT NOTIFIED THAT RX WAS READY TO PICKUP

## 2012-03-30 NOTE — Telephone Encounter (Signed)
Call -- rx ready for pick up.  Please advise pt of current controlled substance policy.   [facility] Policy for Prescribing Controlled Substances (Revised 01/2012) 1. Prescriptions for controlled substances will be filled by ONE provider at [facility] with whom you have established and developed a plan for your care, including follow-up. 2. You are encouraged to schedule an appointment with your prescriber at our appointment center for follow-up visits whenever possible. 3. If you request a prescription for the controlled substance while at [facility] for an acute problem (with someone other than your regular prescriber), you MAY be given a ONE-TIME prescription for a 30-day supply of the controlled substance, to allow time for you to return to see your regular prescriber for additional prescriptions.  She will need appointment in 04/2012 for six month follow-up; please have her schedule OV with me next month at 104.  KMS

## 2012-04-01 NOTE — Telephone Encounter (Signed)
No answer/no VM. Rx is in drawer for p/up. I will put a copy of this note that she needs f/up and new policy w/her Rx in case she p/up bf we can reach her.

## 2012-04-02 NOTE — Telephone Encounter (Signed)
Tried to call pt again. VM full. We do not have permission per HIPPA to call pt at work.

## 2012-04-06 NOTE — Telephone Encounter (Signed)
Pt has p/up her RF and received note I put in envelope.

## 2013-01-08 ENCOUNTER — Other Ambulatory Visit: Payer: Self-pay | Admitting: Physician Assistant

## 2013-05-16 ENCOUNTER — Ambulatory Visit: Payer: Self-pay | Admitting: Emergency Medicine

## 2013-05-16 VITALS — BP 118/68 | HR 82 | Temp 98.2°F | Resp 16 | Ht 65.5 in | Wt 174.6 lb

## 2013-05-16 DIAGNOSIS — F909 Attention-deficit hyperactivity disorder, unspecified type: Secondary | ICD-10-CM

## 2013-05-16 DIAGNOSIS — F988 Other specified behavioral and emotional disorders with onset usually occurring in childhood and adolescence: Secondary | ICD-10-CM

## 2013-05-16 DIAGNOSIS — H66009 Acute suppurative otitis media without spontaneous rupture of ear drum, unspecified ear: Secondary | ICD-10-CM

## 2013-05-16 MED ORDER — AMOXICILLIN 500 MG PO CAPS: 500.0000 mg | ORAL_CAPSULE | Freq: Three times a day (TID) | ORAL | Status: DC

## 2013-05-16 MED ORDER — AMPHETAMINE-DEXTROAMPHETAMINE 20 MG PO TABS: 20.0000 mg | ORAL_TABLET | Freq: Two times a day (BID) | ORAL | Status: DC

## 2013-05-16 NOTE — Progress Notes (Signed)
[facility] [address], [place] [address] [phone]  Date:  05/16/2013   Name:  Sara Beltran   DOB:  May 29, 1975   MRN:  [id number]  PCP:  No primary provider on file.    Chief Complaint: Medication Refill and Ears stopped up   History of Present Illness:  Sara Beltran is a 38 y.o. very pleasant female patient who presents with the following:  Previously under treatment by Dr Hal Hopeichter for ADD and [place]  Was treated there by an [facility] and has resettled here again.  Requests refill on her medications.  Last dose in September.  Finds she is disorganized and unable to focus and accomplish tasks in office and it is interfering with her work. No improvement with over the counter medications or other home remedies. Says her ears are bothering her and interfering with her hearing left worse than right.  no coryza, fever, chills, cough.  Non smoker. Denies other complaint or health concern today.   Patient Active Problem List   Diagnosis Date Noted  . ADD (attention deficit disorder) 05/16/2013  . Iron deficiency anemia 02/05/2011    Past Medical History  Diagnosis Date  . Attention deficit disorder (ADD)   . Iron deficiency anemia 02/05/2011    History reviewed. No pertinent past surgical history.  History  Substance Use Topics  . Smoking status: Never Smoker   . Smokeless tobacco: Not on file  . Alcohol Use: No    History reviewed. No pertinent family history.  No Known Allergies  Medication list has been reviewed and updated.  Current Outpatient Prescriptions on File Prior to Visit  Medication Sig Dispense Refill  . amphetamine-dextroamphetamine (ADDERALL) 20 MG tablet Take 1 tablet (20 mg total) by mouth 2 (two) times daily.  60 tablet  0  . Norethindrone Acetate-Ethinyl Estradiol (MICROGESTIN) 1.5-30 MG-MCG tablet Take 1 tablet by mouth daily. PATIENT NEEDS OFFICE VISIT FOR ADDITIONAL REFILLS  28 tablet  0  .  Norethindrone-Ethinyl Estradiol-Fe Biphas (LO LOESTRIN FE) 1 MG-10 MCG / 10 MCG tablet Take 1 tablet by mouth daily.  1 Package  12   No current facility-administered medications on file prior to visit.    Review of Systems:  As per HPI, otherwise negative.    Physical Examination: Filed Vitals:   05/16/13 1257  BP: 118/68  Pulse: 82  Temp: 98.2 F (36.8 C)  Resp: 16   Filed Vitals:   05/16/13 1257  Height: 5' 5.5" (1.664 m)  Weight: 174 lb 9.6 oz (79.198 kg)   Body mass index is 28.6 kg/(m^2). Ideal Body Weight: Weight in (lb) to have BMI = 25: 152.2   GEN: WDWN, NAD, Non-toxic, Alert & Oriented x 3 HEENT: Atraumatic, Normocephalic.  Ears and Nose: No external deformity.  Left otitis media EXTR: No clubbing/cyanosis/edema NEURO: Normal gait.  PSYCH: Normally interactive. Conversant. Not depressed or anxious appearing.  Calm demeanor.    Assessment and Plan: Otitis media Amoxicillin ADD Amoxicillin   Signed,  Phillips OdorJeffery Bryleigh Ottaway, MD

## 2013-05-16 NOTE — Patient Instructions (Signed)
Otitis Media, Adult Otitis media is redness, soreness, and swelling (inflammation) of the middle ear. Otitis media may be caused by allergies or, most commonly, by infection. Often it occurs as a complication of the common cold. SIGNS AND SYMPTOMS Symptoms of otitis media may include:  Earache.  Fever.  Ringing in your ear.  Headache.  Leakage of fluid from the ear. DIAGNOSIS To diagnose otitis media, your health care provider will examine your ear with an otoscope. This is an instrument that allows your health care provider to see into your ear in order to examine your eardrum. Your health care provider also will ask you questions about your symptoms. TREATMENT  Typically, otitis media resolves on its own within 3 5 days. Your health care provider may prescribe medicine to ease your symptoms of pain. If otitis media does not resolve within 5 days or is recurrent, your health care provider may prescribe antibiotic medicines if he or she suspects that a bacterial infection is the cause. HOME CARE INSTRUCTIONS   Take your medicine as directed until it is gone, even if you feel better after the first few days.  Only take over-the-counter or prescription medicines for pain, discomfort, or fever as directed by your health care provider.  Follow up with your health care provider as directed. SEEK MEDICAL CARE IF:  You have otitis media only in one ear or bleeding from your nose or both.  You notice a lump on your neck.  You are not getting better in 3 5 days.  You feel worse instead of better. SEEK IMMEDIATE MEDICAL CARE IF:   You have pain that is not controlled with medicine.  You have swelling, redness, or pain around your ear or stiffness in your neck.  You notice that part of your face is paralyzed.  You notice that the bone behind your ear (mastoid) is tender when you touch it. MAKE SURE YOU:   Understand these instructions.  Will watch your condition.  Will get help  right away if you are not doing well or get worse. Document Released: 11/23/2003 Document Revised: 12/08/2012 Document Reviewed: 09/14/2012 [employer] Patient Information 2014 [employer].  

## 2013-06-15 ENCOUNTER — Telehealth (HOSPITAL_COMMUNITY): Payer: Self-pay | Admitting: *Deleted

## 2013-06-15 NOTE — Telephone Encounter (Signed)
Telephoned patient at home # and left message to return call to [facility] 

## 2013-09-02 ENCOUNTER — Telehealth: Payer: Self-pay

## 2013-09-02 NOTE — Telephone Encounter (Signed)
Let her know there is no "6 month script"  Max under federal law is three and a revisit.  She must come in for a refill

## 2013-09-02 NOTE — Telephone Encounter (Signed)
Refill on amphetamine-dextroamphetamine (ADDERALL) 20 MG tablet; would like to know if she can go back on her 6 month script, instead of three.

## 2013-09-02 NOTE — Telephone Encounter (Signed)
Lm for pt to RTC for refills 

## 2013-09-14 ENCOUNTER — Ambulatory Visit (INDEPENDENT_AMBULATORY_CARE_PROVIDER_SITE_OTHER): Payer: Self-pay | Admitting: Emergency Medicine

## 2013-09-14 VITALS — BP 130/82 | HR 85 | Temp 98.0°F | Resp 18 | Ht 66.0 in | Wt 165.8 lb

## 2013-09-14 DIAGNOSIS — H66009 Acute suppurative otitis media without spontaneous rupture of ear drum, unspecified ear: Secondary | ICD-10-CM

## 2013-09-14 DIAGNOSIS — F900 Attention-deficit hyperactivity disorder, predominantly inattentive type: Secondary | ICD-10-CM

## 2013-09-14 DIAGNOSIS — F909 Attention-deficit hyperactivity disorder, unspecified type: Secondary | ICD-10-CM

## 2013-09-14 DIAGNOSIS — F988 Other specified behavioral and emotional disorders with onset usually occurring in childhood and adolescence: Secondary | ICD-10-CM

## 2013-09-14 MED ORDER — PSEUDOEPHEDRINE-GUAIFENESIN ER 60-600 MG PO TB12: 1.0000 | ORAL_TABLET | Freq: Two times a day (BID) | ORAL | Status: DC

## 2013-09-14 MED ORDER — AMOXICILLIN-POT CLAVULANATE 875-125 MG PO TABS: 1.0000 | ORAL_TABLET | Freq: Two times a day (BID) | ORAL | Status: DC

## 2013-09-14 MED ORDER — AMPHETAMINE-DEXTROAMPHETAMINE 20 MG PO TABS: 20.0000 mg | ORAL_TABLET | Freq: Two times a day (BID) | ORAL | Status: DC

## 2013-09-14 NOTE — Progress Notes (Signed)
[facility] [address], [place] [address] [phone]  Date:  09/14/2013   Name:  Sara HarshWendy Horiuchi   DOB:  1975-08-13   MRN:  [id number]  PCP:  No PCP Per Patient    Chief Complaint: Medication Refill and Otalgia   History of Present Illness:  Sara HarshWendy Christofferson is a 38 y.o. very pleasant female patient who presents with the following:  Comes for refill of medication for ADD. Tolerating the medication well with no adverse effect.   Has pain in left ear.  No fever or chills, cough or coryza.  No rash or sore throat.  No improvement with over the counter medications or other home remedies. Denies other complaint or health concern today.   Patient Active Problem List   Diagnosis Date Noted  . ADD (attention deficit disorder) 05/16/2013  . Iron deficiency anemia 02/05/2011    Past Medical History  Diagnosis Date  . Attention deficit disorder (ADD)   . Iron deficiency anemia 02/05/2011    History reviewed. No pertinent past surgical history.  History  Substance Use Topics  . Smoking status: Never Smoker   . Smokeless tobacco: Not on file  . Alcohol Use: No    History reviewed. No pertinent family history.  No Known Allergies  Medication list has been reviewed and updated.  Current Outpatient Prescriptions on File Prior to Visit  Medication Sig Dispense Refill  . amphetamine-dextroamphetamine (ADDERALL) 20 MG tablet Take 1 tablet (20 mg total) by mouth 2 (two) times daily. Do not fill prior to 06/15/13  60 tablet  0  . Norethindrone Acetate-Ethinyl Estradiol (MICROGESTIN) 1.5-30 MG-MCG tablet Take 1 tablet by mouth daily. PATIENT NEEDS OFFICE VISIT FOR ADDITIONAL REFILLS  28 tablet  0   No current facility-administered medications on file prior to visit.    Review of Systems:  As per HPI, otherwise negative.    Physical Examination: Filed Vitals:   09/14/13 1537  BP: 130/82  Pulse: 85  Temp: 98 F (36.7 C)  Resp: 18   Filed Vitals:   09/14/13 1537  Height: 5\' 6"  (1.676 m)  Weight: 165 lb 12.8 oz (75.206 kg)   Body mass index is 26.77 kg/(m^2). Ideal Body Weight: Weight in (lb) to have BMI = 25: 154.6   GEN: WDWN, NAD, Non-toxic, Alert & Oriented x 3 HEENT: Atraumatic, Normocephalic.  Ears and Nose: No external deformity.  Left otitis media EXTR: No clubbing/cyanosis/edema NEURO: Normal gait.  PSYCH: Normally interactive. Conversant. Not depressed or anxious appearing.  Calm demeanor.    Assessment and Plan: Left otitis media Amoxicillin ADD Refill Suggested she see a regular provider.   Signed,  Phillips OdorJeffery Nevaen Tredway, MD

## 2013-09-14 NOTE — Patient Instructions (Signed)
Otitis Media With Effusion Otitis media with effusion is the presence of fluid in the middle ear. This is a common problem in children, which often follows ear infections. It may be present for weeks or longer after the infection. Unlike an acute ear infection, otitis media with effusion refers only to fluid behind the ear drum and not infection. Children with repeated ear and sinus infections and allergy problems are the most likely to get otitis media with effusion. CAUSES  The most frequent cause of the fluid buildup is dysfunction of the eustachian tubes. These are the tubes that drain fluid in the ears to the to the back of the nose (nasopharynx). SYMPTOMS   The main symptom of this condition is hearing loss. As a result, you or your child may:  Listen to the TV at a loud volume.  Not respond to questions.  Ask "what" often when spoken to.  Mistake or confuse on sound or word for another.  There may be a sensation of fullness or pressure but usually not pain. DIAGNOSIS   Your health care provider will diagnose this condition by examining you or your child's ears.  Your health care provider may test the pressure in you or your child's ear with a tympanometer.  A hearing test may be conducted if the problem persists. TREATMENT   Treatment depends on the duration and the effects of the effusion.  Antibiotics, decongestants, nose drops, and cortisone-type drugs (tablets or nasal spray) may not be helpful.  Children with persistent ear effusions may have delayed language or behavioral problems. Children at risk for developmental delays in hearing, learning, and speech may require referral to a specialist earlier than children not at risk.  You or your child's health care provider may suggest a referral to an ear, nose, and throat surgeon for treatment. The following may help restore normal hearing:  Drainage of fluid.  Placement of ear tubes (tympanostomy tubes).  Removal of  adenoids (adenoidectomy). HOME CARE INSTRUCTIONS   Avoid second hand smoke.  Infants who are breast fed are less likely to have this condition.  Avoid feeding infants while laying flat.  Avoid known environmental allergens.  Avoid people who are sick. SEEK MEDICAL CARE IF:   Hearing is not better in 3 months.  Hearing is worse.  Ear pain.  Drainage from the ear.  Dizziness. MAKE SURE YOU:   Understand these instructions.  Will watch your condition.  Will get help right away if you are not doing well or get worse. Document Released: 03/27/2004 Document Revised: 12/08/2012 Document Reviewed: 09/14/2012 [facility] Patient Information 2015 [place], [place]. This information is not intended to replace advice given to you by your health care provider. Make sure you discuss any questions you have with your health care provider. Attention Deficit Hyperactivity Disorder Attention deficit hyperactivity disorder (ADHD) is a problem with behavior issues based on the way the brain functions (neurobehavioral disorder). It is a common reason for behavior and academic problems in school. SYMPTOMS  There are 3 types of ADHD. The 3 types and some of the symptoms include:  Inattentive.  Gets bored or distracted easily.  Loses or forgets things. Forgets to hand in homework.  Has trouble organizing or completing tasks.  Difficulty staying on task.  An inability to organize daily tasks and school work.  Leaving projects, chores, or homework unfinished.  Trouble paying attention or responding to details. Careless mistakes.  Difficulty following directions. Often seems like is not listening.  Dislikes activities  that require sustained attention (like chores or homework).  Hyperactive-impulsive.  Feels like it is impossible to sit still or stay in a seat. Fidgeting with hands and feet.  Trouble waiting turn.  Talking too much or out of turn. Interruptive.  Speaks or acts  impulsively.  Aggressive, disruptive behavior.  Constantly busy or on the go; noisy.  Often leaves seat when they are expected to remain seated.  Often runs or climbs where it is not appropriate, or feels very restless.  Combined.  Has symptoms of both of the above. Often children with ADHD feel discouraged about themselves and with school. They often perform well below their abilities in school. As children get older, the excess motor activities can calm down, but the problems with paying attention and staying organized persist. Most children do not outgrow ADHD but with good treatment can learn to cope with the symptoms. DIAGNOSIS  When ADHD is suspected, the diagnosis should be made by professionals trained in ADHD. This professional will collect information about the individual suspected of having ADHD. Information must be collected from various settings where the person lives, works, or attends school.  Diagnosis will include:  Confirming symptoms began in childhood.  Ruling out other reasons for the child's behavior.  The health care providers will check with the child's school and check their medical records.  They will talk to teachers and parents.  Behavior rating scales for the child will be filled out by those dealing with the child on a daily basis. A diagnosis is made only after all information has been considered. TREATMENT  Treatment usually includes behavioral treatment, tutoring or extra support in school, and stimulant medicines. Because of the way a person's brain works with ADHD, these medicines decrease impulsivity and hyperactivity and increase attention. This is different than how they would work in a person who does not have ADHD. Other medicines used include antidepressants and certain blood pressure medicines. Most experts agree that treatment for ADHD should address all aspects of the person's functioning. Along with medicines, treatment should include  structured classroom management at school. Parents should reward good behavior, provide constant discipline, and set limits. Tutoring should be available for the child as needed. ADHD is a lifelong condition. If untreated, the disorder can have long-term serious effects into adolescence and adulthood. HOME CARE INSTRUCTIONS   Often with ADHD there is a lot of frustration among family members dealing with the condition. Blame and anger are also feelings that are common. In many cases, because the problem affects the family as a whole, the entire family may need help. A therapist can help the family find better ways to handle the disruptive behaviors of the person with ADHD and promote change. If the person with ADHD is young, most of the therapist's work is with the parents. Parents will learn techniques for coping with and improving their child's behavior. Sometimes only the child with the ADHD needs counseling. Your health care providers can help you make these decisions.  Children with ADHD may need help learning how to organize. Some helpful tips include:  Keep routines the same every day from wake-up time to bedtime. Schedule all activities, including homework and playtime. Keep the schedule in a place where the person with ADHD will often see it. Mark schedule changes as far in advance as possible.  Schedule outdoor and indoor recreation.  Have a place for everything and keep everything in its place. This includes clothing, backpacks, and school supplies.  Encourage writing  down assignments and bringing home needed books. Work with your child's teachers for assistance in organizing school work.  Offer your child a well-balanced diet. Breakfast that includes a balance of whole grains, protein, and fruits or vegetables is especially important for school performance. Children should avoid drinks with caffeine including:  Soft drinks.  Coffee.  Tea.  However, some older children  (adolescents) may find these drinks helpful in improving their attention. Because it can also be common for adolescents with ADHD to become addicted to caffeine, talk with your health care provider about what is a safe amount of caffeine intake for your child.  Children with ADHD need consistent rules that they can understand and follow. If rules are followed, give small rewards. Children with ADHD often receive, and expect, criticism. Look for good behavior and praise it. Set realistic goals. Give clear instructions. Look for activities that can foster success and self-esteem. Make time for pleasant activities with your child. Give lots of affection.  Parents are their children's greatest advocates. Learn as much as possible about ADHD. This helps you become a stronger and better advocate for your child. It also helps you educate your child's teachers and instructors if they feel inadequate in these areas. Parent support groups are often helpful. A national group with local chapters is called Children and Adults with Attention Deficit Hyperactivity Disorder (CHADD). SEEK MEDICAL CARE IF:  Your child has repeated muscle twitches, cough, or speech outbursts.  Your child has sleep problems.  Your child has a marked loss of appetite.  Your child develops depression.  Your child has new or worsening behavioral problems.  Your child develops dizziness.  Your child has a racing heart.  Your child has stomach pains.  Your child develops headaches. SEEK IMMEDIATE MEDICAL CARE IF:  Your child has been diagnosed with depression or anxiety and the symptoms seem to be getting worse.  Your child has been depressed and suddenly appears to have increased energy or motivation.  You are worried that your child is having a bad reaction to a medication he or she is taking for ADHD. Document Released: 02/07/2002 Document Revised: 02/22/2013 Document Reviewed: 10/25/2012 [facility] Patient Information  2015 [place], [place]. This information is not intended to replace advice given to you by your health care provider. Make sure you discuss any questions you have with your health care provider.

## 2013-12-19 ENCOUNTER — Other Ambulatory Visit: Payer: Self-pay | Admitting: Nurse Practitioner

## 2014-01-04 ENCOUNTER — Ambulatory Visit (INDEPENDENT_AMBULATORY_CARE_PROVIDER_SITE_OTHER): Payer: Self-pay | Admitting: Family Medicine

## 2014-01-04 VITALS — BP 110/82 | HR 84 | Temp 98.2°F | Resp 16 | Ht 66.75 in | Wt 161.2 lb

## 2014-01-04 DIAGNOSIS — F900 Attention-deficit hyperactivity disorder, predominantly inattentive type: Secondary | ICD-10-CM

## 2014-01-04 MED ORDER — AMPHETAMINE-DEXTROAMPHETAMINE 20 MG PO TABS: ORAL_TABLET | ORAL | Status: DC

## 2014-01-04 MED ORDER — AMPHETAMINE-DEXTROAMPHETAMINE 20 MG PO TABS: 20.0000 mg | ORAL_TABLET | Freq: Two times a day (BID) | ORAL | Status: DC

## 2014-01-04 NOTE — Patient Instructions (Addendum)
Get a copy of your reports from [place]  I can appointment with somebody at the apartment building for the latter part of January.  [facility] Policy for Prescribing Controlled Substances (Revised 01/2012) 1. Prescriptions for controlled substances will be filled by ONE provider at [facility] with whom you have established and developed a plan for your care, including follow-up. 2. You are encouraged to schedule an appointment with your prescriber at our appointment center for follow-up visits whenever possible. 3. If you request a prescription for the controlled substance while at [facility] for an acute problem (with someone other than your regular prescriber), you MAY be given a ONE-TIME prescription for a 30-day supply of the controlled substance, to allow time for you to return to see your regular prescriber for additional prescriptions.

## 2014-01-04 NOTE — Progress Notes (Signed)
Subjective: 38 year old lady who has been on attention deficit disorder medication since she was in college.sshe was retested in a year or 2 ago and [place]. [place] is now back in [place], has gotten prescriptions filled a couple of times, but does not have a regular provider. We discussed the need for that. She is apparently not able to accomplish tasks when she is not on medications. She is currently in a custody battle for her child, and it is difficult thinking through and working through things without her medications. She is not currently employed and has therefore missed several weeks of medicines. She hopes to go to law school at [employer] [place].  Objective: Not examined  Assessment: Attention deficit disorder  Plan: Adderall 20 mg #60, 3 separate prescriptions for November December and January. Advised her to make an appointment with one of the physicians at 104 in order to be able to get her prescription refilled in late January some time. Per office pulses she needs to be plugged in with somebody regularly for controlled substances. Advised her to get a copy of her evaluation records from [place].

## 2015-01-12 ENCOUNTER — Ambulatory Visit: Payer: Self-pay | Admitting: Family Medicine

## 2016-07-05 ENCOUNTER — Ambulatory Visit: Payer: Self-pay

## 2016-11-05 ENCOUNTER — Ambulatory Visit (INDEPENDENT_AMBULATORY_CARE_PROVIDER_SITE_OTHER): Payer: Self-pay | Admitting: Family Medicine

## 2016-11-05 ENCOUNTER — Encounter: Payer: Self-pay | Admitting: Family Medicine

## 2016-11-05 VITALS — BP 118/75 | HR 114 | Temp 98.2°F | Resp 18 | Ht 66.75 in | Wt 171.6 lb

## 2016-11-05 DIAGNOSIS — G4700 Insomnia, unspecified: Secondary | ICD-10-CM

## 2016-11-05 DIAGNOSIS — F900 Attention-deficit hyperactivity disorder, predominantly inattentive type: Secondary | ICD-10-CM

## 2016-11-05 MED ORDER — AMPHETAMINE-DEXTROAMPHETAMINE 20 MG PO TABS: 20.0000 mg | ORAL_TABLET | Freq: Two times a day (BID) | ORAL | 0 refills | Status: DC

## 2016-11-05 NOTE — Patient Instructions (Signed)
     IF you received an x-ray today, you will receive an invoice from [facility]. Please contact [facility] at [phone] with questions or concerns regarding your invoice.   IF you received labwork today, you will receive an invoice from [facility]. Please contact [facility] at [phone] with questions or concerns regarding your invoice.   Our billing staff will not be able to assist you with questions regarding bills from these companies.  You will be contacted with the lab results as soon as they are available. The fastest way to get your results is to activate your My Chart account. Instructions are located on the last page of this paperwork. If you have not heard from us regarding the results in 2 weeks, please contact this office.     

## 2016-11-05 NOTE — Progress Notes (Signed)
9/5/20186:08 PM  Sara Beltran Aug 08, 1975, 41 y.o. female [id number]  Chief Complaint  Patient presents with  . Medication Refill    adderall hasnt had any since jan due to insurance issuses     HPI:   Patient is a 41 y.o. female with past medical history significant for ADHD who presents today for medication refill.   Patient was diagnosed during her college years and has been on adderall since then. At time of diagnosis seen by neuropsych. Main syumptom is difficulty with concentration and completing tasks.   For past several years, pcp prescribing. However now uninsured and unable to cont to see that pcp. Per [place] CSR last filled in may 2018, rx written by previous pcp.  Was having issues with depression and anxiety late last year.earlier this year, related to relationship issues, resolved now.   Works as Biomedical engineer.   Denies any side effects from medication, denies changes in appetite, palpitations, irritability.  Has chronic insmonia present with or without medication. Tried ambien in the past - did not tolerate at all., using OTC sleep aides but wakes up groggy, watches TV, does social media before bedtime.    Depression screen PHQ 2/9 11/05/2016  Decreased Interest 0  Down, Depressed, Hopeless 0  PHQ - 2 Score 0    No Known Allergies  Current Outpatient Prescriptions on File Prior to Visit  Medication Sig Dispense Refill  . Norethindrone Acetate-Ethinyl Estradiol (MICROGESTIN) 1.5-30 MG-MCG tablet Take 1 tablet by mouth daily. PATIENT NEEDS OFFICE VISIT FOR ADDITIONAL REFILLS 28 tablet 0   No current facility-administered medications on file prior to visit.     Past Medical History:  Diagnosis Date  . Attention deficit disorder (ADD)   . Iron deficiency anemia 02/05/2011    No past surgical history on file.  Social History  Substance Use Topics  . Smoking status: Never Smoker  . Smokeless tobacco: Never Used  . Alcohol use No    No family history on  file.  Review of Systems  Constitutional: Negative for chills and fever.  Respiratory: Negative for cough and shortness of breath.   Cardiovascular: Negative for chest pain, palpitations and leg swelling.  Gastrointestinal: Negative for abdominal pain, nausea and vomiting.     OBJECTIVE:  Blood pressure 118/75, pulse (!) 114, temperature 98.2 F (36.8 C), temperature source Oral, resp. rate 18, height 5' 6.75" (1.695 m), weight 171 lb 9.6 oz (77.8 kg), SpO2 97 %.  Physical Exam  Constitutional: She is oriented to person, place, and time and well-developed, well-nourished, and in no distress.  HENT:  Head: Normocephalic and atraumatic.  Mouth/Throat: Oropharynx is clear and moist. No oropharyngeal exudate.  Eyes: Pupils are equal, round, and reactive to light. EOM are normal. No scleral icterus.  Neck: Neck supple.  Cardiovascular: Normal rate, regular rhythm and normal heart sounds.  Exam reveals no gallop and no friction rub.   No murmur heard. Pulmonary/Chest: Effort normal and breath sounds normal. She has no wheezes. She has no rales.  Musculoskeletal: She exhibits no edema.  Neurological: She is alert and oriented to person, place, and time. Gait normal.  Skin: Skin is warm and dry.       ASSESSMENT and PLAN:  1. Attention deficit hyperactivity disorder (ADHD), predominantly inattentive type Patient per chart review has been stable on this medication for past several years and now unable to cont with PCP due to loss of insurance. [place] CSR reviewed today. UDS drug screen pending. Controlled substance contract  discussed and signed today.  FU in 1 month. - amphetamine-dextroamphetamine (ADDERALL) 20 MG tablet; Take 1 tablet (20 mg total) by mouth 2 (two) times daily.  Dispense: 60 tablet; Refill: 0 - Urine Drug Panel 7  2. Insomnia, unspecified type Discussed importance of good sleep hygiene, recommended trial of OTC melatonin, discussed new medication r/se/b.           Myles LippsIrma M Santiago, MD Primary Care at [facility] [address] [place], [place] [address] Ph.  [phone] Fax [phone]

## 2016-11-06 LAB — URINE DRUG PANEL 7
Amphetamines, Urine: NEGATIVE ng/mL
Barbiturate Quant, Ur: NEGATIVE ng/mL
Benzodiazepine Quant, Ur: NEGATIVE ng/mL
Cannabinoid Quant, Ur: NEGATIVE ng/mL
Cocaine (Metab.): NEGATIVE ng/mL
Opiate Quant, Ur: NEGATIVE ng/mL
PCP Quant, Ur: NEGATIVE ng/mL

## 2016-12-05 ENCOUNTER — Ambulatory Visit (INDEPENDENT_AMBULATORY_CARE_PROVIDER_SITE_OTHER): Payer: Self-pay | Admitting: Family Medicine

## 2016-12-05 ENCOUNTER — Ambulatory Visit: Payer: Self-pay | Admitting: Family Medicine

## 2016-12-05 ENCOUNTER — Encounter: Payer: Self-pay | Admitting: Family Medicine

## 2016-12-05 VITALS — BP 120/68 | HR 99 | Temp 98.0°F | Resp 16 | Ht 66.93 in | Wt 161.0 lb

## 2016-12-05 DIAGNOSIS — F988 Other specified behavioral and emotional disorders with onset usually occurring in childhood and adolescence: Secondary | ICD-10-CM

## 2016-12-05 DIAGNOSIS — Z79899 Other long term (current) drug therapy: Secondary | ICD-10-CM | POA: Insufficient documentation

## 2016-12-05 MED ORDER — AMPHETAMINE-DEXTROAMPHETAMINE 20 MG PO TABS: 20.0000 mg | ORAL_TABLET | Freq: Two times a day (BID) | ORAL | 0 refills | Status: DC

## 2016-12-05 NOTE — Patient Instructions (Signed)
     IF you received an x-ray today, you will receive an invoice from [facility]. Please contact [facility] at [phone] with questions or concerns regarding your invoice.   IF you received labwork today, you will receive an invoice from [facility]. Please contact [facility] at [phone] with questions or concerns regarding your invoice.   Our billing staff will not be able to assist you with questions regarding bills from these companies.  You will be contacted with the lab results as soon as they are available. The fastest way to get your results is to activate your My Chart account. Instructions are located on the last page of this paperwork. If you have not heard from us regarding the results in 2 weeks, please contact this office.     

## 2016-12-05 NOTE — Progress Notes (Signed)
10/5/20189:26 AM  Cathren Harsh 01/04/1976, 41 y.o. female [id number]  Chief Complaint  Patient presents with  . ADHD    follow-up with Med refill    HPI:   Patient is a 41 y.o. female with past medical history significant for ADHD who presents today for follow-up and medication refill.   Patient reports compliance with medication. Denies any side effects, denies palpitations, decreased appetite, insomnia, unwelcome mood changes, chest pain, SOB.  She has been working for the past 2 weeks on required standardized testing for insurance brokers that offer medicare supplemental coverage. She reports successfully being able to complete first test in the series, this had not been possible in the past when she was not on medication.   She has no other concerns today.    Adult ADHD Self Report Scale (most recent)    Adult ADHD Self-Report Scale (ASRS-v1.1) Symptom Checklist - 12/05/16 0908      Part A   1. How often do you have trouble wrapping up the final details of a project, once the challenging parts have been done? (!)  Very Often 2. How often do you have difficulty getting things done in order when you have to do a task that requires organization? (!)  Very Often   3. How often do you have problems remembering appointments or obligations? (!)  Often 4. When you have a task that requires a lot of thought, how often do you avoid or delay getting started? (!)  Very Often   5. How often do you fidget or squirm with your hands or feet when you have to sit down for a long time? (!)  Very Often 6. How often do you feel overly active and compelled to do things, like you were driven by a motor? Sometimes     Part B   7. How often do you make careless mistakes when you have to work on a boring or difficult project? (!)  Often 8. How often do you have difficulty keeping your attention when you are doing boring or repetitive work? (!)  Very Often   9. How often do you have difficulty  concentrating on what people say to you, even when they are speaking to you directly? (!)  Very Often 10. How often do you misplace or have difficulty finding things at home or at work? (!)  Often   11. How often are you distracted by activity or noise around you? (!)  Very Often 12. How often do you leave your seat in meetings or other situations in which you are expected to remain seated? (!)  Sometimes   13. How often do you feel restless or fidgety? Sometimes 14. How often do you have difficulty unwinding and relaxing when you have time to yourself? (!)  Very Often   15. How often do you find yourself talking too much when you are in social situations? (!)  Very Often 16. When you are in a conversation, how often do you find yourself finishing the sentences of the people you are talking to, before they can finish them themselves? (!)  Sometimes   17. How often do you have difficulty waiting your turn in situations when turn taking is required? Sometimes 18. How often do you interrupt others when they are busy? (!)  Sometimes      Depression screen [facility] 2/9 12/05/2016 11/05/2016  Decreased Interest 0 0  Down, Depressed, Hopeless 0 0  PHQ - 2 Score 0 0  No Known Allergies  Prior to Admission medications   Medication Sig Start Date End Date Taking? Authorizing Provider  amphetamine-dextroamphetamine (ADDERALL) 20 MG tablet Take 1 tablet (20 mg total) by mouth 2 (two) times daily. 11/05/16  Yes Myles Lipps, MD  Norethindrone Acetate-Ethinyl Estradiol (MICROGESTIN) 1.5-30 MG-MCG tablet Take 1 tablet by mouth daily. PATIENT NEEDS OFFICE VISIT FOR ADDITIONAL REFILLS 01/08/13  Yes Godfrey Pick PA-C    Past Medical History:  Diagnosis Date  . Attention deficit disorder (ADD)   . Iron deficiency anemia 02/05/2011    History reviewed. No pertinent surgical history.  Social History  Substance Use Topics  . Smoking status: Never Smoker  . Smokeless tobacco: Never Used  . Alcohol use No      History reviewed. No pertinent family history.  ROS Per hpi  OBJECTIVE:  Blood pressure 120/68, pulse 99, temperature 98 F (36.7 C), temperature source Oral, resp. rate 16, height 5' 6.93" (1.7 m), weight 161 lb (73 kg), SpO2 98 %.  Wt Readings from Last 3 Encounters:  12/05/16 161 lb (73 kg)  11/05/16 171 lb 9.6 oz (77.8 kg)  01/04/14 161 lb 3.2 oz (73.1 kg)    Physical Exam  Constitutional: She is oriented to person, place, and time and well-developed, well-nourished, and in no distress.  HENT:  Head: Normocephalic and atraumatic.  Mouth/Throat: Oropharynx is clear and moist. No oropharyngeal exudate.  Eyes: Pupils are equal, round, and reactive to light. EOM are normal. No scleral icterus.  Neck: Neck supple.  Cardiovascular: Normal rate, regular rhythm and normal heart sounds.  Exam reveals no gallop and no friction rub.   No murmur heard. Pulmonary/Chest: Effort normal and breath sounds normal. She has no wheezes. She has no rales.  Musculoskeletal: She exhibits no edema.  Neurological: She is alert and oriented to person, place, and time. Gait normal.  Skin: Skin is warm and dry.     ASSESSMENT and PLAN  1. Attention deficit disorder (ADD) without hyperactivity - Patient remains highly symptomatic on current medication with concerns for tachycardia and weight loss. However she is back to weight of 2015. Overall patient reports tolerating medication well and reports benefits. Will continue to monitor for side effects, consider non stimulant if side effects persist.  - amphetamine-dextroamphetamine (ADDERALL) 20 MG tablet; Take 1 tablet (20 mg total) by mouth 2 (two) times daily.  2. Encounter for long-term current use of high risk medication - Urine Drug Panel 7 [place] CSR reviewed today, appropriate.  Return in about 4 weeks (around 01/02/2017).    Myles Lipps, MD Primary Care at [facility] [address] [place], [place] [address] Ph.  [phone] Fax  [phone]

## 2016-12-09 ENCOUNTER — Ambulatory Visit: Payer: Medicaid Other | Admitting: Family Medicine

## 2016-12-10 LAB — URINE DRUG PANEL 7
Amphetamines, Urine: POSITIVE — AB
Barbiturate Quant, Ur: NEGATIVE ng/mL
Benzodiazepine Quant, Ur: NEGATIVE ng/mL
Cannabinoid Quant, Ur: NEGATIVE ng/mL
Cocaine (Metab.): NEGATIVE ng/mL
Opiate Quant, Ur: NEGATIVE ng/mL
PCP Quant, Ur: NEGATIVE ng/mL

## 2017-07-17 ENCOUNTER — Other Ambulatory Visit: Payer: Self-pay

## 2017-07-17 ENCOUNTER — Emergency Department (HOSPITAL_BASED_OUTPATIENT_CLINIC_OR_DEPARTMENT_OTHER)
Admission: EM | Admit: 2017-07-17 | Discharge: 2017-07-17 | Disposition: A | Discharge status: Home / Self Care | Payer: Medicaid Other | Attending: Emergency Medicine | Admitting: Emergency Medicine

## 2017-07-17 ENCOUNTER — Emergency Department (HOSPITAL_BASED_OUTPATIENT_CLINIC_OR_DEPARTMENT_OTHER): Payer: Medicaid Other

## 2017-07-17 ENCOUNTER — Encounter (HOSPITAL_BASED_OUTPATIENT_CLINIC_OR_DEPARTMENT_OTHER): Payer: Self-pay | Admitting: Emergency Medicine

## 2017-07-17 DIAGNOSIS — D649 Anemia, unspecified: Secondary | ICD-10-CM | POA: Diagnosis not present

## 2017-07-17 DIAGNOSIS — Z79899 Other long term (current) drug therapy: Secondary | ICD-10-CM | POA: Diagnosis not present

## 2017-07-17 DIAGNOSIS — R079 Chest pain, unspecified: Secondary | ICD-10-CM | POA: Diagnosis present

## 2017-07-17 LAB — BASIC METABOLIC PANEL
ANION GAP: 11 (ref 5–15)
BUN: 12 mg/dL (ref 6–20)
CO2: 23 mmol/L (ref 22–32)
Calcium: 9 mg/dL (ref 8.9–10.3)
Chloride: 102 mmol/L (ref 101–111)
Creatinine, Ser: 0.56 mg/dL (ref 0.44–1.00)
GLUCOSE: 98 mg/dL (ref 65–99)
Potassium: 4.3 mmol/L (ref 3.5–5.1)
Sodium: 136 mmol/L (ref 135–145)

## 2017-07-17 LAB — CBC
HEMATOCRIT: 29.7 % — AB (ref 36.0–46.0)
HEMOGLOBIN: 8.7 g/dL — AB (ref 12.0–15.0)
MCH: 17.3 pg — ABNORMAL LOW (ref 26.0–34.0)
MCHC: 29.3 g/dL — AB (ref 30.0–36.0)
MCV: 59.2 fL — ABNORMAL LOW (ref 78.0–100.0)
Platelets: 332 10*3/uL (ref 150–400)
RBC: 5.02 MIL/uL (ref 3.87–5.11)
RDW: 18.5 % — ABNORMAL HIGH (ref 11.5–15.5)
WBC: 4.3 10*3/uL (ref 4.0–10.5)

## 2017-07-17 LAB — TROPONIN I

## 2017-07-17 LAB — D-DIMER, QUANTITATIVE (NOT AT ARMC)

## 2017-07-17 MED ORDER — FERROUS SULFATE 325 (65 FE) MG PO TABS: 325.0000 mg | ORAL_TABLET | Freq: Every day | ORAL | 0 refills | Status: DC

## 2017-07-17 MED ORDER — DOCUSATE SODIUM 100 MG PO CAPS: 100.0000 mg | ORAL_CAPSULE | Freq: Two times a day (BID) | ORAL | 0 refills | Status: AC

## 2017-07-17 NOTE — ED Notes (Signed)
Denies any falls or injuries to chest, no bruising or discoloration noted on chest wall, female RN in room during exam

## 2017-07-17 NOTE — ED Provider Notes (Signed)
[facility] EMERGENCY DEPARTMENT Provider Note  CSN: [id number] Arrival date & time: 07/17/17  1354  History   Chief Complaint Chief Complaint  Patient presents with  . Chest Pain    HPI Sara Beltran is a 42 yo female with a medical history of ADHD who presented to the ED for chest pain. Onset was 2 days ago. Symptoms have been unchanged since that time. The patient's pain is constant. The patient describes the pain as sharp, pleuritic and radiates to the left shoulder. Patient rates pain as a 8/10 in intensity. Associated symptoms are: dyspnea, fatigue and dizziness. Aggravating factors are: deep inspiration. Alleviating factors are: none. Patient's cardiac risk factors are: family history of CAD. Patient's risk factors for DVT/PE: oral contraceptive use. Previous cardiac testing: none.   Patient has not experienced past cardiac symptoms or ACS events in the past. Previously prescribed Adderrall ADHD, but has not taken since last year because she associates it with anemia. Patient was on OCP, but stopped 3 weeks ago. Denies recent travel/immobilization, surgery, leg swelling, cough/hemoptysis. Has had decreased activity for the past couple of months 2/2 dizziness and fatigue. Denies focal neuro complaints.Endorses palpitations. Positive family history of heart disease.   Past Medical History:  Diagnosis Date  . Attention deficit disorder (ADD)   . Iron deficiency anemia 02/05/2011    Patient Active Problem List   Diagnosis Date Noted  . Encounter for long-term current use of high risk medication 12/05/2016  . Insomnia 11/05/2016  . ADD (attention deficit disorder) 05/16/2013  . Iron deficiency anemia 02/05/2011    Past Surgical History:  Procedure Laterality Date  . BREAST SURGERY    . tummy tuck       OB History   None      Home Medications    Prior to Admission medications   Medication Sig Start Date End Date Taking? Authorizing Provider    amphetamine-dextroamphetamine (ADDERALL) 20 MG tablet Take 1 tablet (20 mg total) by mouth 2 (two) times daily. 12/05/16   Myles Lipps, MD  docusate sodium (COLACE) 100 MG capsule Take 1 capsule (100 mg total) by mouth 2 (two) times daily. 07/17/17 08/16/17  Garrett Mitchum, Jerrel Ivory I, PA-C  ferrous sulfate 325 (65 FE) MG tablet Take 1 tablet (325 mg total) by mouth daily. 07/17/17   Corbyn Wildey, Jerrel Ivory I, PA-C  Norethindrone Acetate-Ethinyl Estradiol (MICROGESTIN) 1.5-30 MG-MCG tablet Take 1 tablet by mouth daily. PATIENT NEEDS OFFICE VISIT FOR ADDITIONAL REFILLS 01/08/13   Godfrey Pick, PA-C    Family History No family history on file.  Social History Social History   Tobacco Use  . Smoking status: Never Smoker  . Smokeless tobacco: Never Used  Substance Use Topics  . Alcohol use: No  . Drug use: No     Allergies   Patient has no known allergies.   Review of Systems Review of Systems  Constitutional: Positive for activity change and fatigue. Negative for diaphoresis, fever and unexpected weight change.  Respiratory: Positive for shortness of breath. Negative for cough, chest tightness and wheezing.   Cardiovascular: Positive for chest pain and palpitations. Negative for leg swelling.  Gastrointestinal: Negative for abdominal distention, abdominal pain, constipation, diarrhea, nausea and vomiting.  Endocrine: Negative.   Genitourinary: Negative.   Neurological: Positive for dizziness and light-headedness. Negative for syncope, weakness, numbness and headaches.     Physical Exam Updated Vital Signs BP 112/75 (BP Location: Right Arm)   Pulse 94   Temp 98.8 F (37.1 C) (Oral)  Resp 16   Ht  (1.702 m)   Wt 81.6 kg (180 lb)   LMP 07/01/2017   SpO2 96%   BMI 28.19 kg/m   Physical Exam  Constitutional: She is oriented to person, place, and time. She appears well-developed and well-nourished. She is cooperative. She does not have a sickly appearance. She does not  appear ill. She appears distressed.  Sitting upright in bed in mild distress and looks uncomfortable.  Cardiovascular: Normal rate, regular rhythm, S1 normal, S2 normal, normal heart sounds, intact distal pulses and normal pulses.  No extrasystoles are present. PMI is not displaced.  Pulses:      Carotid pulses are 2+ on the right side, and 2+ on the left side.      Radial pulses are 2+ on the right side, and 2+ on the left side.       Dorsalis pedis pulses are 2+ on the right side, and 2+ on the left side.       Posterior tibial pulses are 2+ on the right side, and 2+ on the left side.  No bruits appreciated.  Pulmonary/Chest: Breath sounds normal. No accessory muscle usage. No tachypnea.  Takes deep breaths especially when in supine position.  Abdominal: Soft. Normal appearance, normal aorta and bowel sounds are normal. There is no tenderness.  Neurological: She is alert and oriented to person, place, and time. She has normal strength. No cranial nerve deficit or sensory deficit.     ED Treatments / Results  Labs (all labs ordered are listed, but only abnormal results are displayed) Labs Reviewed  CBC - Abnormal; Notable for the following components:      Result Value   Hemoglobin 8.7 (*)    HCT 29.7 (*)    MCV 59.2 (*)    MCH 17.3 (*)    MCHC 29.3 (*)    RDW 18.5 (*)    All other components within normal limits  BASIC METABOLIC PANEL  TROPONIN I  D-DIMER, QUANTITATIVE (NOT AT [facility])  PREGNANCY, URINE    EKG EKG Interpretation  Date/Time:  Friday Jul 17 2017 14:00:29 EDT Ventricular Rate:  114 PR Interval:    QRS Duration: 88 QT Interval:  330 QTC Calculation: 455 R Axis:   71 Text Interpretation:  Sinus tachycardia Ventricular premature complex Aberrant complex Right atrial enlargement Consider left ventricular hypertrophy Baseline wander in lead(s) V2 Confirmed by Blane Ohara [phone]) on 07/17/2017 3:32:42 PM   Radiology Dg Chest 2 View  Result Date:  07/17/2017 CLINICAL DATA:  Shortness of breath and chest pain EXAM: CHEST - 2 VIEW COMPARISON:  April 14, 2016 FINDINGS: Lungs are clear. Heart size and pulmonary vascularity are normal. No adenopathy. No pneumothorax. No bone lesions. IMPRESSION: No edema or consolidation. Electronically Signed   By: Bretta Bang III M.D.   On: 07/17/2017 14:15    Procedures Procedures (including critical care time)  Medications Ordered in ED Medications - No data to display   Initial Impression / Assessment and Plan / ED Course  Triage vital signs and the nursing notes have been reviewed.  Pertinent labs & imaging results that were available during care of the patient were reviewed and considered in medical decision making (see chart for details).  Sara Beltran is a 42 yo female with a medical history of ADHD who presented to the ED for chest pain. Upon arrival, patient was tachycardic at 116 with normal BP, RR and O2 sat. Patient has SOB on exam, but is  able to ambulate and converse without issue and does not require supplemental oxygen or support. Patient's clinical presentation is most concerning for ACS and PE, despite patient's low risk factors. HEART score of 1 which suggests low risk of major cardiac event. PERC score of 2. EKG showed NSR with no signs of ST elevations or acute infarct or ischemia. Troponin and d-dimer were negative. CXR was normal.   CMP was WNL. CBC showed anemia with hemoglobin of 8.7. Patient has past dx of IDA and most Hgb was 14.1 in 2013. She does have past Hgb of 8.3 in 2011. Patient has no signs of acute blood loss and does not meet criteria for transfusion at this time.  Patient's chest pain, SOB and dizziness are most likely 2/2 to anemia.  Final Clinical Impressions(s) / ED Diagnoses  1. Anemia. Hemoglobin 8.7. Last recorded Hgb was 14.1 in 04/2011. Education on anemia was provided to patient. 30 day iron supplement and stool softener given to patient. Patient was  informed to follow-up with PCP in 2-4 weeks for repeat blood work. Education on red flags and reasons to return to the ED.  Dispo: Home. After thorough clinical evaluation, this patient is determined to be medically stable and can be safely discharged with the previously mentioned treatment and/or outpatient follow-up/referral(s). At this time, there are no other apparent medical conditions that require further screening, evaluation or treatment.   Final diagnoses:  Anemia, unspecified type    ED Discharge Orders        Ordered    ferrous sulfate 325 (65 FE) MG tablet  Daily     07/17/17 1536    docusate sodium (COLACE) 100 MG capsule  2 times daily     07/17/17 1536        Kacia Halley, [place] I, PA-C 07/17/17 1556    Blane Ohara, MD 07/18/17 2349

## 2017-07-17 NOTE — ED Notes (Signed)
Patient transported to X-ray via stretcher, sr x 2 up with X-ray tech

## 2017-07-17 NOTE — ED Notes (Signed)
States still having some SOB, speech wnl, BS remain clear, color WNL, cont to c/o stabbing type chest pain that cont to radiate from Left breast to Left axilla

## 2017-07-17 NOTE — ED Notes (Signed)
Negative Homans sign noted

## 2017-07-17 NOTE — ED Notes (Signed)
Pt states she is still unable to give [place] a urine sample

## 2017-07-17 NOTE — ED Notes (Signed)
Orders for Chest Pain immediately implemented

## 2017-07-17 NOTE — ED Notes (Signed)
12 lead ECG also reviewed by this RN

## 2017-07-17 NOTE — Discharge Instructions (Addendum)
Follow-up with a PCP for repeat hemoglobin and iron levels in 2-4 weeks. Take iron supplements daily. OTC stool softener can be used in conjunction with iron supplements if you experience constipation.

## 2017-07-17 NOTE — ED Notes (Signed)
Communicated with Lab Staff to also include D-dimer STAT

## 2017-07-17 NOTE — ED Notes (Signed)
Pt laying down for orthostatics to be started

## 2017-07-17 NOTE — ED Notes (Signed)
Pt presented to ED today with having increased tachypnea and dyspnea, having chest pain that initates at left breast and radiates to left scapula area. Having some nausea, no vomiting,

## 2017-07-17 NOTE — ED Notes (Signed)
Pt returns from x-ray, placed back on cont cardiac monitoring with int NBP, at q9min, with cont pox as well.

## 2017-07-17 NOTE — ED Triage Notes (Signed)
Chest pain x several days radiating to shoulder blades. C/o SOB with exertion. No recent long travel.

## 2017-12-01 ENCOUNTER — Emergency Department (HOSPITAL_BASED_OUTPATIENT_CLINIC_OR_DEPARTMENT_OTHER): Payer: Medicaid Other

## 2017-12-01 ENCOUNTER — Observation Stay (HOSPITAL_BASED_OUTPATIENT_CLINIC_OR_DEPARTMENT_OTHER)
Admission: EM | Admit: 2017-12-01 | Discharge: 2017-12-03 | Disposition: A | Discharge status: Home / Self Care | Payer: Medicaid Other | Attending: Internal Medicine | Admitting: Internal Medicine

## 2017-12-01 ENCOUNTER — Other Ambulatory Visit: Payer: Self-pay

## 2017-12-01 ENCOUNTER — Encounter (HOSPITAL_BASED_OUTPATIENT_CLINIC_OR_DEPARTMENT_OTHER): Payer: Self-pay

## 2017-12-01 DIAGNOSIS — N920 Excessive and frequent menstruation with regular cycle: Secondary | ICD-10-CM | POA: Diagnosis not present

## 2017-12-01 DIAGNOSIS — I2782 Chronic pulmonary embolism: Secondary | ICD-10-CM | POA: Diagnosis not present

## 2017-12-01 DIAGNOSIS — D72829 Elevated white blood cell count, unspecified: Secondary | ICD-10-CM | POA: Diagnosis present

## 2017-12-01 DIAGNOSIS — K5900 Constipation, unspecified: Secondary | ICD-10-CM | POA: Diagnosis not present

## 2017-12-01 DIAGNOSIS — I509 Heart failure, unspecified: Secondary | ICD-10-CM

## 2017-12-01 DIAGNOSIS — D509 Iron deficiency anemia, unspecified: Secondary | ICD-10-CM | POA: Diagnosis not present

## 2017-12-01 DIAGNOSIS — F988 Other specified behavioral and emotional disorders with onset usually occurring in childhood and adolescence: Secondary | ICD-10-CM | POA: Diagnosis present

## 2017-12-01 DIAGNOSIS — G4700 Insomnia, unspecified: Secondary | ICD-10-CM | POA: Diagnosis not present

## 2017-12-01 DIAGNOSIS — R0602 Shortness of breath: Secondary | ICD-10-CM | POA: Insufficient documentation

## 2017-12-01 LAB — CBC WITH DIFFERENTIAL/PLATELET
BASOS PCT: 0 %
Basophils Absolute: 0 10*3/uL (ref 0.0–0.1)
EOS PCT: 2 %
Eosinophils Absolute: 0.2 10*3/uL (ref 0.0–0.7)
HCT: 27.5 % — ABNORMAL LOW (ref 36.0–46.0)
Hemoglobin: 8.1 g/dL — ABNORMAL LOW (ref 12.0–15.0)
Lymphocytes Relative: 43 %
Lymphs Abs: 5.3 10*3/uL — ABNORMAL HIGH (ref 0.7–4.0)
MCH: 16.9 pg — AB (ref 26.0–34.0)
MCHC: 29.5 g/dL — AB (ref 30.0–36.0)
MCV: 57.4 fL — AB (ref 78.0–100.0)
MONO ABS: 1.6 10*3/uL — AB (ref 0.1–1.0)
Monocytes Relative: 13 %
NEUTROS ABS: 5.2 10*3/uL (ref 1.7–7.7)
Neutrophils Relative %: 42 %
PLATELETS: 552 10*3/uL — AB (ref 150–400)
RBC: 4.79 MIL/uL (ref 3.87–5.11)
RDW: 19 % — AB (ref 11.5–15.5)
WBC: 12.3 10*3/uL — AB (ref 4.0–10.5)

## 2017-12-01 LAB — BASIC METABOLIC PANEL
Anion gap: 12 (ref 5–15)
BUN: 21 mg/dL — AB (ref 6–20)
CALCIUM: 8.5 mg/dL — AB (ref 8.9–10.3)
CO2: 25 mmol/L (ref 22–32)
Chloride: 102 mmol/L (ref 98–111)
Creatinine, Ser: 0.9 mg/dL (ref 0.44–1.00)
GFR calc Af Amer: >60 mL/min (ref >60)
GLUCOSE: 90 mg/dL (ref 70–99)
Potassium: 4 mmol/L (ref 3.5–5.1)
Sodium: 139 mmol/L (ref 135–145)

## 2017-12-01 LAB — D-DIMER, QUANTITATIVE: D-Dimer, Quant: 0.59 ug/mL-FEU — ABNORMAL HIGH (ref 0.00–0.50)

## 2017-12-01 LAB — PREGNANCY, URINE: Preg Test, Ur: NEGATIVE

## 2017-12-01 LAB — BRAIN NATRIURETIC PEPTIDE: B Natriuretic Peptide: 267.3 pg/mL — ABNORMAL HIGH (ref 0.0–100.0)

## 2017-12-01 LAB — TROPONIN I: Troponin I: <0.03 ng/mL (ref <0.03)

## 2017-12-01 MED ORDER — IPRATROPIUM-ALBUTEROL 0.5-2.5 (3) MG/3ML IN SOLN
3.0000 mL | Freq: Four times a day (QID) | RESPIRATORY_TRACT | Status: DC
  Administered 2017-12-01 – 2017-12-02 (×2): 3 mL via RESPIRATORY_TRACT
  Filled 2017-12-01 (×2): qty 3

## 2017-12-01 MED ORDER — AMPHETAMINE-DEXTROAMPHETAMINE 10 MG PO TABS
20.0000 mg | ORAL_TABLET | Freq: Two times a day (BID) | ORAL | Status: DC
  Administered 2017-12-02 (×2): 20 mg via ORAL
  Filled 2017-12-01 (×3): qty 2

## 2017-12-01 MED ORDER — ASPIRIN 81 MG PO CHEW
324.0000 mg | CHEWABLE_TABLET | Freq: Once | ORAL | Status: AC
  Administered 2017-12-01: 324 mg via ORAL
  Filled 2017-12-01: qty 4

## 2017-12-01 MED ORDER — IOPAMIDOL (ISOVUE-370) INJECTION 76%
100.0000 mL | Freq: Once | INTRAVENOUS | Status: AC | PRN
  Administered 2017-12-01: 100 mL via INTRAVENOUS

## 2017-12-01 MED ORDER — FERROUS SULFATE 325 (65 FE) MG PO TABS
325.0000 mg | ORAL_TABLET | Freq: Every day | ORAL | Status: DC
  Administered 2017-12-02 – 2017-12-03 (×2): 325 mg via ORAL
  Filled 2017-12-01 (×2): qty 1

## 2017-12-01 MED ORDER — ENOXAPARIN SODIUM 120 MG/0.8ML ~~LOC~~ SOLN
1.5000 mg/kg | Freq: Once | SUBCUTANEOUS | Status: AC
  Administered 2017-12-01: 115 mg via SUBCUTANEOUS
  Filled 2017-12-01: qty 0.8

## 2017-12-01 NOTE — ED Triage Notes (Addendum)
C/o SOB x 6 months-was last seen by PCP last week and started on abx, prednisone and inhaler-CXR done but was not given results-c/o pain to left UE with "color changes" to left hand x today

## 2017-12-01 NOTE — H&P (Signed)
History and Physical    Sara Beltran ZOX:[id number] DOB: [birth year] DOA: 12/01/2017  Referring MD/NP/PA:   PCP: Patient, No Pcp Per   Patient coming from:  The patient is coming from home.  At baseline, pt is independent for most of ADL.   Chief Complaint: SOB and Chest pain  HPI: Sara Beltran is a 42 y.o. female with medical history significant of iron deficiency anemia, ADD, currently taking oral contraceptive pills, who presents with shortness breath and chest pain.  Patient states that she has been having shortness of breath for more than 6 months.  It is associated with intermittent chest pain.  The chest pain is located in the left side of chest, sharp, burning-like pain, radiating to the left arm, it can reach 8 out of 10 severity, currently 4 out of 10 severity today.  Both chest pain or shortness breath are exertional.  Denies long distant traveling or tenderness in the calf areas.  Patient has dry cough, but no fever or chills.  Patient was seen by PCP, and started her with Augmentin, prednisone and inhaler on 9/25, without significant help. Patient does not have nausea, vomiting, diarrhea or abdominal pain pain no symptoms of UTI or unilateral weakness.  Patient is taking OCP pills currently. Pt reports mild left forearm and hand swelling recently.  ED Course: pt was found to have WBC 12.3, hemoglobin 8.1 (8.7 on 07/17/2017), BNP 267.3, negative pregnancy test, electrolytes renal function okay, temperature 99.4, no tachycardia, has tachypnea, no oxygen desaturation.  Chest x-ray negative.  LU venous doppler negative for DVT. CTA done in ED showed small linear web like filling defects within subsegmental left lower lobe pulmonary branch vessels, thought to reflect sequela of chronic pulmonary emboli. No definite acute filling defects are visualized. EDP discussed with radiology who states that there could be a subacute versus resolving PE to subsegmental branches in the LLL. Patient is placed  on telemetry bed of observation.  1 dose of Lovenox was given in ED.  Review of Systems:   General: no fevers, chills, no body weight gain, has fatigue HEENT: no blurry vision, hearing changes or sore throat Respiratory: has dyspnea, coughing, no wheezing CV: has chest pain, no palpitations GI: no nausea, vomiting, abdominal pain, diarrhea, constipation GU: no dysuria, burning on urination, increased urinary frequency, hematuria  Ext: no leg edema Neuro: no unilateral weakness, numbness, or tingling, no vision change or hearing loss Skin: no rash, no skin tear. MSK: No muscle spasm, no deformity, no limitation of range of movement in spin Heme: No easy bruising.  Travel history: No recent long distant travel.  Allergy: No Known Allergies  Past Medical History:  Diagnosis Date  . Attention deficit disorder (ADD)   . Iron deficiency anemia 02/05/2011    Past Surgical History:  Procedure Laterality Date  . BREAST SURGERY    . tummy tuck      Social History:  reports that she has never smoked. She has never used smokeless tobacco. She reports that she does not drink alcohol or use drugs.  Family History:  Family History  Problem Relation Age of Onset  . Diabetes Mellitus II Mother   . Heart disease Mother   . Heart disease Father   . Hypertension Father   . Hypertension Sister      Prior to Admission medications   Medication Sig Start Date End Date Taking? Authorizing Provider  amphetamine-dextroamphetamine (ADDERALL) 20 MG tablet Take 1 tablet (20 mg total) by mouth 2 (two)  times daily. 12/05/16   Myles Lipps, MD  ferrous sulfate 325 (65 FE) MG tablet Take 1 tablet (325 mg total) by mouth daily. 07/17/17   Mortis, Jerrel Ivory I, PA-C  Norethindrone Acetate-Ethinyl Estradiol (MICROGESTIN) 1.5-30 MG-MCG tablet Take 1 tablet by mouth daily. PATIENT NEEDS OFFICE VISIT FOR ADDITIONAL REFILLS 01/08/13   Godfrey Pick, PA-C    Physical Exam: Vitals:   12/01/17 2115  12/01/17 2200 12/01/17 2314 12/02/17 0050  BP: 121/81 123/90 111/66   Pulse: 90 85 82   Resp: 19 (!) 22 20   Temp:   99.4 F (37.4 C)   TempSrc:   Oral   SpO2: 99% 99% 99% 100%  Weight:   75.9 kg   Height:   5\' 6"  (1.676 m)    General: Not in acute distress HEENT:       Eyes: PERRL, EOMI, no scleral icterus.       ENT: No discharge from the ears and nose, no pharynx injection, no tonsillar enlargement.        Neck: No JVD, no bruit, no mass felt. Heme: No neck lymph node enlargement. Cardiac: S1/S2, RRR, No murmurs, No gallops or rubs. Respiratory: No rales, wheezing, rhonchi or rubs. GI: Soft, nondistended, nontender, no rebound pain, no organomegaly, BS present. GU: No hematuria Ext: No pitting leg edema bilaterally. 2+DP/PT pulse bilaterally. Musculoskeletal: No joint deformities, No joint redness or warmth, no limitation of ROM in spin. Skin: No rashes.  Neuro: Alert, oriented X3, cranial nerves II-XII grossly intact, moves all extremities normally.  Psych: Patient is not psychotic, no suicidal or hemocidal ideation.  Labs on Admission: I have personally reviewed following labs and imaging studies  CBC: Recent Labs  Lab 12/01/17 1541  WBC 12.3*  NEUTROABS 5.2  HGB 8.1*  HCT 27.5*  MCV 57.4*  PLT 552*   Basic Metabolic Panel: Recent Labs  Lab 12/01/17 1541  NA 139  K 4.0  CL 102  CO2 25  GLUCOSE 90  BUN 21*  CREATININE 0.90  CALCIUM 8.5*   GFR: Estimated Creatinine Clearance: 84.7 mL/min (by C-G formula based on SCr of 0.9 mg/dL). Liver Function Tests: No results for input(s): AST, ALT, ALKPHOS, BILITOT, PROT, ALBUMIN in the last 168 hours. No results for input(s): LIPASE, AMYLASE in the last 168 hours. No results for input(s): AMMONIA in the last 168 hours. Coagulation Profile: No results for input(s): INR, PROTIME in the last 168 hours. Cardiac Enzymes: Recent Labs  Lab 12/01/17 1541  TROPONINI <0.03   BNP (last 3 results) No results for  input(s): PROBNP in the last 8760 hours. HbA1C: No results for input(s): HGBA1C in the last 72 hours. CBG: No results for input(s): GLUCAP in the last 168 hours. Lipid Profile: No results for input(s): CHOL, HDL, LDLCALC, TRIG, CHOLHDL, LDLDIRECT in the last 72 hours. Thyroid Function Tests: No results for input(s): TSH, T4TOTAL, FREET4, T3FREE, THYROIDAB in the last 72 hours. Anemia Panel: No results for input(s): VITAMINB12, FOLATE, FERRITIN, TIBC, IRON, RETICCTPCT in the last 72 hours. Urine analysis:    Component Value Date/Time   COLORURINE YELLOW 08/13/2010 1453   APPEARANCEUR CLEAR 08/13/2010 1453   LABSPEC 1.025 08/13/2010 1453   PHURINE 8.5 (H) 08/13/2010 1453   GLUCOSEU NEGATIVE 08/13/2010 1453   HGBUR NEGATIVE 08/13/2010 1453   BILIRUBINUR NEGATIVE 08/13/2010 1453   KETONESUR NEGATIVE 08/13/2010 1453   PROTEINUR NEGATIVE 08/13/2010 1453   UROBILINOGEN 0.2 08/13/2010 1453   NITRITE NEGATIVE 08/13/2010 1453   LEUKOCYTESUR NEGATIVE  08/13/2010 1453   Sepsis Labs: @LABRCNTIP (procalcitonin:4,lacticidven:4) )No results found for this or any previous visit (from the past 240 hour(s)).   Radiological Exams on Admission: Ct Angio Chest Pe W And/or Wo Contrast  Result Date: 12/01/2017 CLINICAL DATA:  Left chest pain shortness of breath with exertion EXAM: CT ANGIOGRAPHY CHEST WITH CONTRAST TECHNIQUE: Multidetector CT imaging of the chest was performed using the standard protocol during bolus administration of intravenous contrast. Multiplanar CT image reconstructions and MIPs were obtained to evaluate the vascular anatomy. CONTRAST:  ISOVUE-370 IOPAMIDOL (ISOVUE-370) INJECTION 76% COMPARISON:  Chest x-ray 07/17/2017 FINDINGS: Cardiovascular: Satisfactory opacification of the pulmonary arteries to the segmental level. Linear web like filling defects within left lower lobe subsegmental pulmonary branch vessels. No acute central embolus is seen. Nonaneurysmal aorta. Normal heart  size. No pericardial effusion. Mediastinum/Nodes: No enlarged mediastinal, hilar, or axillary lymph nodes. Thyroid gland, trachea, and esophagus demonstrate no significant findings. Lungs/Pleura: Lungs are clear. No pleural effusion or pneumothorax. Upper Abdomen: No acute abnormality. Musculoskeletal: Bilateral breast implants. No acute or suspicious abnormality. Review of the MIP images confirms the above findings. IMPRESSION: 1. Small linear web like filling defects within subsegmental left lower lobe pulmonary branch vessels, thought to reflect sequela of chronic pulmonary emboli. No definite acute filling defects are visualized. 2. Clear lung fields Electronically Signed   By: Jasmine Pang M.D.   On: 12/01/2017 17:49   US Venous Img Upper Left (dvt Study)  Result Date: 12/01/2017 CLINICAL DATA:  Left upper extremity pain and discoloration EXAM: LEFT UPPER EXTREMITY VENOUS DOPPLER ULTRASOUND TECHNIQUE: Gray-scale sonography with graded compression, as well as color Doppler and duplex ultrasound were performed to evaluate the upper extremity deep venous system from the level of the subclavian vein and including the jugular, axillary, basilic, radial, ulnar and upper cephalic vein. Spectral Doppler was utilized to evaluate flow at rest and with distal augmentation maneuvers. COMPARISON:  None. FINDINGS: Contralateral Subclavian Vein: Respiratory phasicity is normal and symmetric with the symptomatic side. No evidence of thrombus. Normal compressibility. Internal Jugular Vein: No evidence of thrombus. Normal compressibility, respiratory phasicity and response to augmentation. Subclavian Vein: No evidence of thrombus. Normal compressibility, respiratory phasicity and response to augmentation. Axillary Vein: No evidence of thrombus. Normal compressibility, respiratory phasicity and response to augmentation. Cephalic Vein: No evidence of thrombus. Normal compressibility, respiratory phasicity and response to  augmentation. Basilic Vein: No evidence of thrombus. Normal compressibility, respiratory phasicity and response to augmentation. Brachial Veins: No evidence of thrombus. Normal compressibility, respiratory phasicity and response to augmentation. Radial Veins: No evidence of thrombus. Normal compressibility, respiratory phasicity and response to augmentation. Ulnar Veins: No evidence of thrombus. Normal compressibility, respiratory phasicity and response to augmentation. Venous Reflux:  None visualized. Other Findings:  None visualized. IMPRESSION: No evidence of DVT within the left upper extremity. Electronically Signed   By: Judie Petit.  Shick M.D.   On: 12/01/2017 18:23     EKG: Independently reviewed.  Sinus rhythm, QTC 467, nonspecific T wave change.  Assessment/Plan Principal Problem:   Chronic pulmonary embolism (HCC) Active Problems:   Iron deficiency anemia   ADD (attention deficit disorder)   Leukocytosis   Chronic pulmonary embolism (HCC): No oxygen desaturation.  Patient has elevated BNP 267, but does not have any leg edema or JVD, no pulmonary edema on chest x-ray.  Clinically does not have acute CHF.  PE is likely provoked by OCP use.  Currently hemodynamically stable.  Patient does not have fever or leukocytosis, clinically does not have  pneumonia or bacterial bronchitis.  Will discontinue Augmentin.  -place on tele bed for obs -Lovenox was initiated per pharm -2D echocardiogram ordered -LE dopplers ordered to evaluate for DVT -trop x 3 -Hypercoag panel  prn albuterol nebs -pain control: When necessary Percocet and morphine -d/c OCP  Iron deficiency anemia: Hemoglobin 8.7 on 07/17/2017--> 8.1 today.  No active bleeding tendency. -Continue iron supplement  ADD (attention deficit disorder): -Adderall  Leukocytosis: No fever or signs of infection.  Likely due to steroid-induced demargination. -Follow-up with CBC   DVT ppx: on full dose of  Lovenox Code Status: Full code Family  Communication:  Yes, patient's husband at bed side Disposition Plan:  Anticipate discharge back to previous home environment Consults called:  none Admission status: Obs / tele    Date of Service 12/02/2017    Lorretta Harp [facility] Pager [phone]  If 7PM-7AM, please contact night-coverage [email or web address] Password [email or web address] 12/02/2017, 1:21 AM

## 2017-12-01 NOTE — ED Notes (Signed)
Pt is now back from CT and [place]

## 2017-12-01 NOTE — ED Notes (Signed)
ED Provider at bedside. 

## 2017-12-01 NOTE — ED Notes (Signed)
Pt ambulated to and from restroom without difficulty and without SOB, maintained O2 saturation of 99%

## 2017-12-01 NOTE — ED Provider Notes (Addendum)
[facility] EMERGENCY DEPARTMENT Provider Note   CSN: [id number] Arrival date & time: 12/01/17  1429     History   Chief Complaint Chief Complaint  Patient presents with  . Shortness of Breath    HPI Sara Beltran is a 42 y.o. female.  HPI   Pt is a 42 year old female with history of ADHD, iron deficiency anemia (2/2 heavy menses) who presents emergency department today for evaluation of chest pain and shortness of breath.  Patient states that over the last several months she has had shortness of breath with exertion and sometimes at rest.  States that she cannot sleep when she tries to sleep on her right side.  She also reports increased fatigue and a cough over the last few weeks.  She was seen by her PCP and states she was started on Augmentin, prednisone, and albuterol inhaler and cough medication which has improved her cough but not her shortness of breath or chest pain.  States chest pain is located to the left side of her chest and radiates down her arm to her fingers.  States she has pain to her third fourth and fifth fingers and thinks that her fingers are "turning purple".  States chest pain is worse whenever she tries to exert herself.  She reports pain with minimal exertion including walking the dogs are making her bed.  Pain improves at rest.  States that during her visit with her PCP she also had a chest x-ray completed.  She states that she was told that she would get the results of her chest x-ray at her next follow-up visit.  She denies any fevers, chills, leg pain/swelling, hemoptysis, recent surgery/trauma, recent long travel,  personal hx of cancer, or hx of DVT/PE. Denies family h/o clotting disorder. Reports family hx of MIs in the 91s (maternal aunt). Pt on OCPs. No h/o tobacco use.  Pts husband is at bedside and assists with the history. He states he is concerned for a blood clot or heart failure.  Reviewed records. I cannot see records of PCP visit.  Past  Medical History:  Diagnosis Date  . Attention deficit disorder (ADD)   . Iron deficiency anemia 02/05/2011    Patient Active Problem List   Diagnosis Date Noted  . Shortness of breath 12/01/2017  . Chronic pulmonary embolism (HCC) 12/01/2017  . Leukocytosis 12/01/2017  . Encounter for long-term current use of high risk medication 12/05/2016  . Insomnia 11/05/2016  . ADD (attention deficit disorder) 05/16/2013  . Iron deficiency anemia 02/05/2011    Past Surgical History:  Procedure Laterality Date  . BREAST SURGERY    . tummy tuck       OB History   None      Home Medications    Prior to Admission medications   Medication Sig Start Date End Date Taking? Authorizing Provider  amphetamine-dextroamphetamine (ADDERALL) 20 MG tablet Take 1 tablet (20 mg total) by mouth 2 (two) times daily. 12/05/16   Myles Lipps, MD  ferrous sulfate 325 (65 FE) MG tablet Take 1 tablet (325 mg total) by mouth daily. 07/17/17   Mortis, Jerrel Ivory I, PA-C  Norethindrone Acetate-Ethinyl Estradiol (MICROGESTIN) 1.5-30 MG-MCG tablet Take 1 tablet by mouth daily. PATIENT NEEDS OFFICE VISIT FOR ADDITIONAL REFILLS 01/08/13   Godfrey Pick, PA-C    Family History No family history on file.  Social History Social History   Tobacco Use  . Smoking status: Never Smoker  . Smokeless tobacco: Never Used  Substance Use Topics  . Alcohol use: No  . Drug use: No     Allergies   Patient has no known allergies.   Review of Systems Review of Systems  Constitutional: Positive for fatigue. Negative for chills and fever.  Eyes: Negative for visual disturbance.  Respiratory: Positive for cough and shortness of breath. Negative for wheezing.   Cardiovascular: Positive for chest pain and palpitations (after using albuterol). Negative for leg swelling.  Gastrointestinal: Negative for abdominal pain, constipation, diarrhea, nausea and vomiting.  Genitourinary: Negative for dysuria.  Musculoskeletal:         Left arm pain, left finger pain, now LUE swelling  Skin: Positive for color change.  Neurological: Negative for dizziness, weakness, light-headedness, numbness and headaches.     Physical Exam Updated Vital Signs BP 111/66   Pulse 82   Temp 99.4 F (37.4 C) (Oral)   Resp 20   Ht 5\' 6"  (1.676 m)   Wt 75.9 kg   LMP 11/04/2017   SpO2 99%   BMI 27.00 kg/m   Physical Exam  Constitutional: She appears well-developed and well-nourished. She does not appear ill. No distress.  HENT:  Head: Normocephalic and atraumatic.  Mouth/Throat: Oropharynx is clear and moist.  Eyes: Conjunctivae are normal.  Neck: Neck supple.  Cardiovascular: Normal rate, regular rhythm and normal heart sounds.  No murmur heard. Pulmonary/Chest: Effort normal and breath sounds normal. No respiratory distress. She has no decreased breath sounds. She has no wheezes. She has no rhonchi. She has no rales.  Abdominal: Soft. Bowel sounds are normal. She exhibits no distension and no mass. There is no tenderness. There is no guarding.  Musculoskeletal: She exhibits no edema.       Right lower leg: Normal. She exhibits no tenderness and no edema.       Left lower leg: Normal. She exhibits no tenderness and no edema.  TTP to the left 3rd, 4th, and 5th fingers. Also with reported sensory changes to these fingers and along ulnar distribution on forearm. Pain not exacerbated with percussion of ulnar nerve. Radial and ulnar pulses are intact bilaterally. 5/5 strength to BUE.   Neurological: She is alert.  Skin: Skin is warm and dry.  Psychiatric: She has a normal mood and affect.  Nursing note and vitals reviewed.    ED Treatments / Results  Labs (all labs ordered are listed, but only abnormal results are displayed) Labs Reviewed  CBC WITH DIFFERENTIAL/PLATELET - Abnormal; Notable for the following components:      Result Value   WBC 12.3 (*)    Hemoglobin 8.1 (*)    HCT 27.5 (*)    MCV 57.4 (*)    MCH 16.9  (*)    MCHC 29.5 (*)    RDW 19.0 (*)    Platelets 552 (*)    Lymphs Abs 5.3 (*)    Monocytes Absolute 1.6 (*)    All other components within normal limits  BASIC METABOLIC PANEL - Abnormal; Notable for the following components:   BUN 21 (*)    Calcium 8.5 (*)    All other components within normal limits  D-DIMER, QUANTITATIVE (NOT AT [facility]) - Abnormal; Notable for the following components:   D-Dimer, Quant 0.59 (*)    All other components within normal limits  BRAIN NATRIURETIC PEPTIDE - Abnormal; Notable for the following components:   B Natriuretic Peptide 267.3 (*)    All other components within normal limits  TROPONIN I  PREGNANCY, URINE  ANTITHROMBIN III  PROTEIN C ACTIVITY  PROTEIN C, TOTAL  PROTEIN S ACTIVITY  PROTEIN S, TOTAL  LUPUS ANTICOAGULANT PANEL  BETA-2-GLYCOPROTEIN I ABS, IGG/M/A  HOMOCYSTEINE  FACTOR 5 LEIDEN  PROTHROMBIN GENE MUTATION  CARDIOLIPIN ANTIBODIES, IGG, IGM, IGA    EKG EKG Interpretation  Date/Time:  Tuesday December 01 2017 14:53:59 EDT Ventricular Rate:  92 PR Interval:    QRS Duration: 98 QT Interval:  369 QTC Calculation: 457 R Axis:   58 Text Interpretation:  Sinus rhythm Probable left ventricular hypertrophy Baseline wander in lead(s) V2 When compared to prior, no signifiant changes seen.  No STEMI Confirmed by Theda Belfast ([id number]) on 12/01/2017 4:29:36 PM   Radiology Ct Angio Chest Pe W And/or Wo Contrast  Result Date: 12/01/2017 CLINICAL DATA:  Left chest pain shortness of breath with exertion EXAM: CT ANGIOGRAPHY CHEST WITH CONTRAST TECHNIQUE: Multidetector CT imaging of the chest was performed using the standard protocol during bolus administration of intravenous contrast. Multiplanar CT image reconstructions and MIPs were obtained to evaluate the vascular anatomy. CONTRAST:  ISOVUE-370 IOPAMIDOL (ISOVUE-370) INJECTION 76% COMPARISON:  Chest x-ray 07/17/2017 FINDINGS: Cardiovascular: Satisfactory opacification of the  pulmonary arteries to the segmental level. Linear web like filling defects within left lower lobe subsegmental pulmonary branch vessels. No acute central embolus is seen. Nonaneurysmal aorta. Normal heart size. No pericardial effusion. Mediastinum/Nodes: No enlarged mediastinal, hilar, or axillary lymph nodes. Thyroid gland, trachea, and esophagus demonstrate no significant findings. Lungs/Pleura: Lungs are clear. No pleural effusion or pneumothorax. Upper Abdomen: No acute abnormality. Musculoskeletal: Bilateral breast implants. No acute or suspicious abnormality. Review of the MIP images confirms the above findings. IMPRESSION: 1. Small linear web like filling defects within subsegmental left lower lobe pulmonary branch vessels, thought to reflect sequela of chronic pulmonary emboli. No definite acute filling defects are visualized. 2. Clear lung fields Electronically Signed   By: Jasmine Pang M.D.   On: 12/01/2017 17:49   US Venous Img Upper Left (dvt Study)  Result Date: 12/01/2017 CLINICAL DATA:  Left upper extremity pain and discoloration EXAM: LEFT UPPER EXTREMITY VENOUS DOPPLER ULTRASOUND TECHNIQUE: Gray-scale sonography with graded compression, as well as color Doppler and duplex ultrasound were performed to evaluate the upper extremity deep venous system from the level of the subclavian vein and including the jugular, axillary, basilic, radial, ulnar and upper cephalic vein. Spectral Doppler was utilized to evaluate flow at rest and with distal augmentation maneuvers. COMPARISON:  None. FINDINGS: Contralateral Subclavian Vein: Respiratory phasicity is normal and symmetric with the symptomatic side. No evidence of thrombus. Normal compressibility. Internal Jugular Vein: No evidence of thrombus. Normal compressibility, respiratory phasicity and response to augmentation. Subclavian Vein: No evidence of thrombus. Normal compressibility, respiratory phasicity and response to augmentation. Axillary Vein: No  evidence of thrombus. Normal compressibility, respiratory phasicity and response to augmentation. Cephalic Vein: No evidence of thrombus. Normal compressibility, respiratory phasicity and response to augmentation. Basilic Vein: No evidence of thrombus. Normal compressibility, respiratory phasicity and response to augmentation. Brachial Veins: No evidence of thrombus. Normal compressibility, respiratory phasicity and response to augmentation. Radial Veins: No evidence of thrombus. Normal compressibility, respiratory phasicity and response to augmentation. Ulnar Veins: No evidence of thrombus. Normal compressibility, respiratory phasicity and response to augmentation. Venous Reflux:  None visualized. Other Findings:  None visualized. IMPRESSION: No evidence of DVT within the left upper extremity. Electronically Signed   By: Judie Petit.  Shick M.D.   On: 12/01/2017 18:23    Procedures Procedures (including critical care time)  Medications  Ordered in ED Medications  ipratropium-albuterol (DUONEB) 0.5-2.5 (3) MG/3ML nebulizer solution 3 mL (3 mLs Nebulization Not Given 12/01/17 2044)  aspirin chewable tablet 324 mg (324 mg Oral Given 12/01/17 1553)  iopamidol (ISOVUE-370) 76 % injection 100 mL (100 mLs Intravenous Contrast Given 12/01/17 1702)  enoxaparin (LOVENOX) injection 115 mg (115 mg Subcutaneous Given 12/01/17 2109)     Initial Impression / Assessment and Plan / ED Course  I have reviewed the triage vital signs and the nursing notes.  Pertinent labs & imaging results that were available during my care of the patient were reviewed by me and considered in my medical decision making (see chart for details).   6:13 PM CONSULT with Dr. Jake Samples with radiology who stated that she does not see an acute PE, but that there may be could have subacute or resolving PE.   Final Clinical Impressions(s) / ED Diagnoses   Final diagnoses:  Chronic pulmonary embolism, unspecified pulmonary embolism type, unspecified  whether acute cor pulmonale present (HCC)  Acute heart failure, unspecified heart failure type (HCC)   Pt presenting with chest pain and and shortness of breath on exertion. VSS, afebrile. Cardiac and pulmonary exams are benign.  CBC with mild leukocytosis to 12.3 and thrombocytosis above 500. Also with anemia that appears grossly stable with her baseline of 8.  BMP with normal electrolytes. Elevated BUN at 21, normal Cr.  Trop negative. DDimer is mildly elevated at 0.59, will proceed with CTA to r/o PE. BNP elevated at 267 and suspect acute heart failure given patients sxs.  ECG with NSR, HR 92. LVH with baseline wander leads in V2. Unchanged from prior. No ischemic changes.  Pt was ambulated in the ED with pulse ox and maintained O2 sats to 99% on RA.   CTA with small linear web like filling defects within subsegmental left lower lobe pulmonary branch vessels, thought to reflect sequela of chronic pulmonary emboli. No definite acute filling defects are visualized. Discussed with radiology who states that there could be a subacute versus resolving PE to subsegmental branches in the LLL.  [place] of LUE negative for DVT.  Will plan for admission given findings of possible chronic PE and pt with elevated BNP suspicious for heart failure.  9:00 PM CONSULT with Dr. Altha Harm with hospitalist service at [facility] who accepts the patient for admission. He recommends giving full dose of lovenox.   ED Discharge Orders    None       Karrie Meres, PA-C 12/01/17 2348    Karrie Meres, PA-C 12/01/17 2350    Tegeler, Canary Brim, MD 12/01/17 2356

## 2017-12-01 NOTE — ED Notes (Signed)
Report given to [facility] 

## 2017-12-01 NOTE — ED Notes (Signed)
Patient transported to CT 

## 2017-12-01 NOTE — ED Notes (Signed)
[facility] notified - patient ready for transport Benna Dunks)

## 2017-12-01 NOTE — ED Notes (Signed)
Attempted report, RN unavailable, will call back for report 

## 2017-12-02 ENCOUNTER — Observation Stay (HOSPITAL_BASED_OUTPATIENT_CLINIC_OR_DEPARTMENT_OTHER): Payer: Medicaid Other

## 2017-12-02 ENCOUNTER — Encounter (HOSPITAL_COMMUNITY): Payer: Self-pay | Admitting: Internal Medicine

## 2017-12-02 DIAGNOSIS — D509 Iron deficiency anemia, unspecified: Secondary | ICD-10-CM | POA: Diagnosis not present

## 2017-12-02 DIAGNOSIS — I2699 Other pulmonary embolism without acute cor pulmonale: Secondary | ICD-10-CM | POA: Diagnosis not present

## 2017-12-02 DIAGNOSIS — F988 Other specified behavioral and emotional disorders with onset usually occurring in childhood and adolescence: Secondary | ICD-10-CM | POA: Diagnosis not present

## 2017-12-02 DIAGNOSIS — I509 Heart failure, unspecified: Secondary | ICD-10-CM | POA: Diagnosis not present

## 2017-12-02 DIAGNOSIS — Z79899 Other long term (current) drug therapy: Secondary | ICD-10-CM | POA: Diagnosis not present

## 2017-12-02 DIAGNOSIS — I2782 Chronic pulmonary embolism: Secondary | ICD-10-CM | POA: Diagnosis not present

## 2017-12-02 DIAGNOSIS — Z7901 Long term (current) use of anticoagulants: Secondary | ICD-10-CM

## 2017-12-02 DIAGNOSIS — I5030 Unspecified diastolic (congestive) heart failure: Secondary | ICD-10-CM | POA: Diagnosis not present

## 2017-12-02 LAB — LIPID PANEL
Cholesterol: 118 mg/dL (ref 0–200)
HDL: 46 mg/dL (ref >40)
LDL CALC: 58 mg/dL (ref 0–99)
TRIGLYCERIDES: 72 mg/dL (ref <150)
Total CHOL/HDL Ratio: 2.6 RATIO
VLDL: 14 mg/dL (ref 0–40)

## 2017-12-02 LAB — ABO/RH: ABO/RH(D): A POS

## 2017-12-02 LAB — CBC
HEMATOCRIT: 25.5 % — AB (ref 36.0–46.0)
Hemoglobin: 7 g/dL — ABNORMAL LOW (ref 12.0–15.0)
MCH: 16.4 pg — ABNORMAL LOW (ref 26.0–34.0)
MCHC: 27.5 g/dL — ABNORMAL LOW (ref 30.0–36.0)
MCV: 59.7 fL — ABNORMAL LOW (ref 78.0–100.0)
PLATELETS: 387 10*3/uL (ref 150–400)
RBC: 4.27 MIL/uL (ref 3.87–5.11)
RDW: 18.4 % — AB (ref 11.5–15.5)
WBC: 10.5 10*3/uL (ref 4.0–10.5)

## 2017-12-02 LAB — PREPARE RBC (CROSSMATCH)

## 2017-12-02 LAB — ECHOCARDIOGRAM COMPLETE
Height: 66 in
Weight: 2673.6 oz

## 2017-12-02 LAB — BASIC METABOLIC PANEL
ANION GAP: 9 (ref 5–15)
BUN: 15 mg/dL (ref 6–20)
CO2: 25 mmol/L (ref 22–32)
Calcium: 8.1 mg/dL — ABNORMAL LOW (ref 8.9–10.3)
Chloride: 101 mmol/L (ref 98–111)
Creatinine, Ser: 0.53 mg/dL (ref 0.44–1.00)
GLUCOSE: 87 mg/dL (ref 70–99)
Potassium: 3.9 mmol/L (ref 3.5–5.1)
Sodium: 135 mmol/L (ref 135–145)

## 2017-12-02 LAB — IRON AND TIBC
IRON: 40 ug/dL (ref 28–170)
Saturation Ratios: 8 % — ABNORMAL LOW (ref 10.4–31.8)
TIBC: 514 ug/dL — AB (ref 250–450)
UIBC: 474 ug/dL

## 2017-12-02 LAB — SAVE SMEAR

## 2017-12-02 LAB — TROPONIN I
Troponin I: <0.03 ng/mL (ref <0.03)
Troponin I: <0.03 ng/mL (ref <0.03)
Troponin I: <0.03 ng/mL (ref <0.03)

## 2017-12-02 LAB — FERRITIN: Ferritin: 2 ng/mL — ABNORMAL LOW (ref 11–307)

## 2017-12-02 LAB — HIV ANTIBODY (ROUTINE TESTING W REFLEX): HIV Screen 4th Generation wRfx: NONREACTIVE

## 2017-12-02 LAB — ANTITHROMBIN III: AntiThromb III Func: 101 % (ref 75–120)

## 2017-12-02 MED ORDER — DM-GUAIFENESIN ER 30-600 MG PO TB12: 1.0000 | ORAL_TABLET | Freq: Two times a day (BID) | ORAL | Status: DC | PRN

## 2017-12-02 MED ORDER — ALBUTEROL SULFATE (2.5 MG/3ML) 0.083% IN NEBU: 2.5000 mg | INHALATION_SOLUTION | RESPIRATORY_TRACT | Status: DC | PRN

## 2017-12-02 MED ORDER — ACETAMINOPHEN 325 MG PO TABS: 650.0000 mg | ORAL_TABLET | Freq: Four times a day (QID) | ORAL | Status: DC | PRN

## 2017-12-02 MED ORDER — SODIUM CHLORIDE 0.9% IV SOLUTION
Freq: Once | INTRAVENOUS | Status: AC
  Administered 2017-12-02: 18:00:00 via INTRAVENOUS

## 2017-12-02 MED ORDER — MORPHINE SULFATE (PF) 2 MG/ML IV SOLN: 2.0000 mg | INTRAVENOUS | Status: DC | PRN

## 2017-12-02 MED ORDER — ACETAMINOPHEN 650 MG RE SUPP: 650.0000 mg | Freq: Four times a day (QID) | RECTAL | Status: DC | PRN

## 2017-12-02 MED ORDER — OXYCODONE-ACETAMINOPHEN 5-325 MG PO TABS: 1.0000 | ORAL_TABLET | ORAL | Status: DC | PRN

## 2017-12-02 MED ORDER — RIVAROXABAN 20 MG PO TABS: 20.0000 mg | ORAL_TABLET | Freq: Every day | ORAL | Status: DC

## 2017-12-02 MED ORDER — ONDANSETRON HCL 4 MG/2ML IJ SOLN: 4.0000 mg | Freq: Four times a day (QID) | INTRAMUSCULAR | Status: DC | PRN

## 2017-12-02 MED ORDER — SENNOSIDES-DOCUSATE SODIUM 8.6-50 MG PO TABS: 1.0000 | ORAL_TABLET | Freq: Every evening | ORAL | Status: DC | PRN

## 2017-12-02 MED ORDER — ENOXAPARIN SODIUM 120 MG/0.8ML ~~LOC~~ SOLN: 115.0000 mg | SUBCUTANEOUS | Status: DC

## 2017-12-02 MED ORDER — RIVAROXABAN 15 MG PO TABS
15.0000 mg | ORAL_TABLET | Freq: Two times a day (BID) | ORAL | Status: DC
  Administered 2017-12-02 – 2017-12-03 (×2): 15 mg via ORAL
  Filled 2017-12-02 (×2): qty 1

## 2017-12-02 MED ORDER — ZOLPIDEM TARTRATE 5 MG PO TABS: 5.0000 mg | ORAL_TABLET | Freq: Every evening | ORAL | Status: DC | PRN

## 2017-12-02 MED ORDER — ONDANSETRON HCL 4 MG PO TABS: 4.0000 mg | ORAL_TABLET | Freq: Four times a day (QID) | ORAL | Status: DC | PRN

## 2017-12-02 NOTE — Progress Notes (Signed)
ANTICOAGULATION CONSULT NOTE - Initial Consult  Pharmacy Consult for Xarelto  Indication: pulmonary embolus  No Known Allergies  Patient Measurements: Height: 5\' 6"  (167.6 cm) Weight: 167 lb 1.6 oz (75.8 kg) IBW/kg (Calculated) : 59.3  Vital Signs: Temp: 98 F (36.7 C) (10/02 1647) Temp Source: Oral (10/02 1647) BP: 136/86 (10/02 1647) Pulse Rate: 97 (10/02 1647)  Labs: Recent Labs    12/01/17 1541 12/02/17 0013 12/02/17 0546 12/02/17 1140  HGB 8.1*  --  7.0*  --   HCT 27.5*  --  25.5*  --   PLT 552*  --  387  --   CREATININE 0.90  --  0.53  --   TROPONINI <0.03 <0.03 <0.03 <0.03    Estimated Creatinine Clearance: 95.3 mL/min (by C-G formula based on SCr of 0.53 mg/dL).   Medical History: Past Medical History:  Diagnosis Date  . Attention deficit disorder (ADD)   . Iron deficiency anemia 02/05/2011    Assessment: CC/HPI: new PE  PMH: ADD, iron deficiency anemia  Anticoag: New PE. LMWH>Xarelto - 42 y/o, 75kg, Scr 0.53, Hgb down to 7 today. Plts 552>387  Goal of Therapy:  Therapeutic oral anticoagulation    Plan:  Xarelto 15mg  BID x 21d, then 20mg  daily Watch Hgb closely   Jermond Burkemper S. Merilynn Finland, PharmD, BCPS Clinical Staff Pharmacist [phone]  Misty Stanley Stillinger 12/02/2017,4:51 PM

## 2017-12-02 NOTE — Progress Notes (Signed)
ANTICOAGULATION CONSULT NOTE - Initial Consult  Pharmacy Consult for Lovenox Indication: pulmonary embolus  No Known Allergies  Patient Measurements: Height: 5\' 6"  (167.6 cm) Weight: 167 lb 4.8 oz (75.9 kg) IBW/kg (Calculated) : 59.3 Heparin Dosing Weight: 75.9 kg  Vital Signs: Temp: 99.4 F (37.4 C) (10/01 2314) Temp Source: Oral (10/01 2314) BP: 111/66 (10/01 2314) Pulse Rate: 82 (10/01 2314)  Labs: Recent Labs    12/01/17 1541  HGB 8.1*  HCT 27.5*  PLT 552*  CREATININE 0.90  TROPONINI <0.03    Estimated Creatinine Clearance: 84.7 mL/min (by C-G formula based on SCr of 0.9 mg/dL).   Medical History: Past Medical History:  Diagnosis Date  . Attention deficit disorder (ADD)   . Iron deficiency anemia 02/05/2011    Medications:  Medications Prior to Admission  Medication Sig Dispense Refill Last Dose  . amphetamine-dextroamphetamine (ADDERALL) 20 MG tablet Take 1 tablet (20 mg total) by mouth 2 (two) times daily. 60 tablet 0   . ferrous sulfate 325 (65 FE) MG tablet Take 1 tablet (325 mg total) by mouth daily. 30 tablet 0   . Norethindrone Acetate-Ethinyl Estradiol (MICROGESTIN) 1.5-30 MG-MCG tablet Take 1 tablet by mouth daily. PATIENT NEEDS OFFICE VISIT FOR ADDITIONAL REFILLS 28 tablet 0 Taking    Assessment: 42 yo female admitted with new PE.  Pharmacy asked to continue anticoagulation with full-dose Lovenox.  Received 115 mg of Lovenox (1.5 mg/kg) in ED at 2100 pm.  Hgb low at baseline, pltcelevated okay  Goal of Therapy:  Anti-Xa level 0.6-1 units/ml 4hrs after LMWH dose given Monitor platelets by anticoagulation protocol: Yes   Plan:  1. Continue Lovenox 115 mg (1.5 mg/kg) q 24 hrs for now. 2. F/u plans for oral anticoagulation. 3. CBC q 72 hrs.  Jenetta Downer, [facility] Clinical Pharmacist Phone [phone]  12/02/2017 12:13 AM

## 2017-12-02 NOTE — Discharge Summary (Signed)
Discharge Summary  Sara Beltran LKG:[id number] DOB: 15-Mar-1975  PCP: Patient, No Pcp Per  Admit date: 12/01/2017 Discharge date: 12/02/2017  Time spent: 25 minutes  Recommendations for Outpatient Follow-up:  1. Follow-up with your OB/GYN 2. Follow-up with hematology Dr. Mosetta Putt 3. Take your medications as prescribed  Discharge Diagnoses:  Active Hospital Problems   Diagnosis Date Noted  . Chronic pulmonary embolism (HCC) 12/01/2017  . Leukocytosis 12/01/2017  . ADD (attention deficit disorder) 05/16/2013  . Iron deficiency anemia 02/05/2011    Resolved Hospital Problems  No resolved problems to display.    Discharge Condition: Stable  Diet recommendation: Resume previous diet  Vitals:   12/02/17 0845 12/02/17 1247  BP: 123/61 (!) 125/56  Pulse: 72 67  Resp: 18 18  Temp: 98.4 F (36.9 C) 98.2 F (36.8 C)  SpO2: 100% 99%    History of present illness:   Sara Beltran is a 42 y.o. female with medical history significant of iron deficiency anemia, ADD, currently taking oral contraceptive pills, who presents with shortness breath and chest pain.  Patient states that she has been having shortness of breath for more than 6 months.  It is associated with intermittent chest pain.  The chest pain is located in the left side of chest, sharp, burning-like pain, radiating to the left arm, it can reach 8 out of 10 severity, currently 4 out of 10 severity today.  Both chest pain or shortness breath are exertional.  Denies long distant traveling or tenderness in the calf areas.  Patient has dry cough, but no fever or chills.  Patient was seen by PCP, and started her with Augmentin, prednisone and inhaler on 9/25, without significant help. Patient does not have nausea, vomiting, diarrhea or abdominal pain pain no symptoms of UTI or unilateral weakness.  Patient is taking OCP pills currently. Pt reports mild left forearm and hand swelling recently.  12/02/2017: Patient seen and examined at  bedside.  States she is not currently on her menses.  Low hemoglobin this morning 7.0 from 8.1.  Persistent dyspnea on ambulation.  Initially refused blood transfusion now agreeable after discussing with her husband.  Patient states she was on oral contraception due to heavy menses.  Thoughts are that OCP provoked her pulmonary embolism.  Patient advised not to resume OCP until otherwise recommended by hematology and gynecology.  Patient understands and agrees to plan.  Hematologist Dr. Mosetta Putt consulted and will see the patient this afternoon.  Patient will also follow-up with hematology outpatient.  Case manager consulted to review insurance coverage for Xarelto or Eliquis.  Patient would like to go home.  Hospital Course:  Principal Problem:   Chronic pulmonary embolism (HCC) Active Problems:   Iron deficiency anemia   ADD (attention deficit disorder)   Leukocytosis  Chronic pulmonary embolism (HCC): No oxygen desaturation.  Patient has elevated BNP 267, but does not have any leg edema or JVD, no pulmonary edema on chest x-ray.  Clinically does not have acute CHF.  PE is likely provoked by OCP use.  Currently hemodynamically stable.  Patient does not have fever or leukocytosis, clinically does not have pneumonia or bacterial bronchitis.  Will discontinue Augmentin. -Lovenox was initiated per pharm; DC full dose lovenox and start xarelto -2D echocardiogram negative for right heart strain.  Preserved LVEF. -LE dopplers negative for acute DVT -trop x 3 all negative -d/c OCP -Hematologist Dr. Mosetta Putt consulted and will also follow with the patient outpatient. -Continue Xarelto until otherwise recommended by hematology  Iron  deficiency anemia/symptomatic anemia: Hemoglobin 8.7 on 07/17/2017--> 8.1---> 7.0.  -Fatigue and dyspnea with minimal exertion -2 unit PRBCs ordered to be transfused -No active bleeding tendency. -Continue iron supplement -MCV 59  ADD (attention deficit  disorder): -Adderall  Resolved leukocytosis: No fever or signs of infection.  Likely due to steroid-induced demargination.     Procedures:  None  Consultations:  Hematology  Discharge Exam: BP (!) 125/56   Pulse 67   Temp 98.2 F (36.8 C) (Oral)   Resp 18   Ht 5\' 6"  (1.676 m)   Wt 75.8 kg   LMP 11/04/2017   SpO2 99%   BMI 26.97 kg/m  . General: 42 y.o. year-old female well developed well nourished in no acute distress.  Alert and oriented x3. . Cardiovascular: Regular rate and rhythm with no rubs or gallops.  No thyromegaly or JVD noted.   Marland Kitchen Respiratory: Clear to auscultation with no wheezes or rales. Good inspiratory effort. . Abdomen: Soft nontender nondistended with normal bowel sounds x4 quadrants. . Musculoskeletal: No lower extremity edema. 2/4 pulses in all 4 extremities. . Skin: No ulcerative lesions noted or rashes, . Psychiatry: Mood is appropriate for condition and setting  Discharge Instructions You were cared for by a hospitalist during your hospital stay. If you have any questions about your discharge medications or the care you received while you were in the hospital after you are discharged, you can call the unit and asked to speak with the hospitalist on call if the hospitalist that took care of you is not available. Once you are discharged, your primary care physician will handle any further medical issues. Please note that NO REFILLS for any discharge medications will be authorized once you are discharged, as it is imperative that you return to your primary care physician (or establish a relationship with a primary care physician if you do not have one) for your aftercare needs so that they can reassess your need for medications and monitor your lab values.   Allergies as of 12/02/2017   No Known Allergies     Medication List    STOP taking these medications   amoxicillin-clavulanate 875-125 MG tablet Commonly known as:  AUGMENTIN    amphetamine-dextroamphetamine 20 MG tablet Commonly known as:  ADDERALL   Norethindrone Acetate-Ethinyl Estradiol 1.5-30 MG-MCG tablet Commonly known as:  JUNEL,LOESTRIN,MICROGESTIN   norethindrone-ethinyl estradiol-iron 1.5-30 MG-MCG tablet Commonly known as:  MICROGESTIN FE,GILDESS FE,LOESTRIN FE   predniSONE 10 MG (21) Tbpk tablet Commonly known as:  STERAPRED UNI-PAK 21 TAB     TAKE these medications   albuterol 108 (90 Base) MCG/ACT inhaler Commonly known as:  PROVENTIL HFA;VENTOLIN HFA Inhale 2 puffs into the lungs every 6 (six) hours as needed for wheezing or shortness of breath.   ferrous sulfate 325 (65 FE) MG tablet Take 1 tablet (325 mg total) by mouth daily.      No Known Allergies Follow-up Information    Malachy Mood, MD. Call in 1 day(s).   Specialties:  Hematology, Oncology Why:  Please call for a post hospital follow-up appointment. Contact information: [address] [place] [address] [phone]        [facility]. Call in 1 day(s).   Why:  Please call for a post hospital follow-up appointment. Contact information: [address] [place] [address] [phone]           The results of significant diagnostics from this hospitalization (including imaging, microbiology, ancillary and  laboratory) are listed below for reference.    Significant Diagnostic Studies: Ct Angio Chest Pe W And/or Wo Contrast  Result Date: 12/01/2017 CLINICAL DATA:  Left chest pain shortness of breath with exertion EXAM: CT ANGIOGRAPHY CHEST WITH CONTRAST TECHNIQUE: Multidetector CT imaging of the chest was performed using the standard protocol during bolus administration of intravenous contrast. Multiplanar CT image reconstructions and MIPs were obtained to evaluate the vascular anatomy. CONTRAST:  ISOVUE-370 IOPAMIDOL (ISOVUE-370) INJECTION 76% COMPARISON:  Chest x-ray 07/17/2017 FINDINGS:  Cardiovascular: Satisfactory opacification of the pulmonary arteries to the segmental level. Linear web like filling defects within left lower lobe subsegmental pulmonary branch vessels. No acute central embolus is seen. Nonaneurysmal aorta. Normal heart size. No pericardial effusion. Mediastinum/Nodes: No enlarged mediastinal, hilar, or axillary lymph nodes. Thyroid gland, trachea, and esophagus demonstrate no significant findings. Lungs/Pleura: Lungs are clear. No pleural effusion or pneumothorax. Upper Abdomen: No acute abnormality. Musculoskeletal: Bilateral breast implants. No acute or suspicious abnormality. Review of the MIP images confirms the above findings. IMPRESSION: 1. Small linear web like filling defects within subsegmental left lower lobe pulmonary branch vessels, thought to reflect sequela of chronic pulmonary emboli. No definite acute filling defects are visualized. 2. Clear lung fields Electronically Signed   By: Jasmine Pang M.D.   On: 12/01/2017 17:49   US Venous Img Upper Left (dvt Study)  Result Date: 12/01/2017 CLINICAL DATA:  Left upper extremity pain and discoloration EXAM: LEFT UPPER EXTREMITY VENOUS DOPPLER ULTRASOUND TECHNIQUE: Gray-scale sonography with graded compression, as well as color Doppler and duplex ultrasound were performed to evaluate the upper extremity deep venous system from the level of the subclavian vein and including the jugular, axillary, basilic, radial, ulnar and upper cephalic vein. Spectral Doppler was utilized to evaluate flow at rest and with distal augmentation maneuvers. COMPARISON:  None. FINDINGS: Contralateral Subclavian Vein: Respiratory phasicity is normal and symmetric with the symptomatic side. No evidence of thrombus. Normal compressibility. Internal Jugular Vein: No evidence of thrombus. Normal compressibility, respiratory phasicity and response to augmentation. Subclavian Vein: No evidence of thrombus. Normal compressibility, respiratory  phasicity and response to augmentation. Axillary Vein: No evidence of thrombus. Normal compressibility, respiratory phasicity and response to augmentation. Cephalic Vein: No evidence of thrombus. Normal compressibility, respiratory phasicity and response to augmentation. Basilic Vein: No evidence of thrombus. Normal compressibility, respiratory phasicity and response to augmentation. Brachial Veins: No evidence of thrombus. Normal compressibility, respiratory phasicity and response to augmentation. Radial Veins: No evidence of thrombus. Normal compressibility, respiratory phasicity and response to augmentation. Ulnar Veins: No evidence of thrombus. Normal compressibility, respiratory phasicity and response to augmentation. Venous Reflux:  None visualized. Other Findings:  None visualized. IMPRESSION: No evidence of DVT within the left upper extremity. Electronically Signed   By: Judie Petit.  Shick M.D.   On: 12/01/2017 18:23    Microbiology: No results found for this or any previous visit (from the past 240 hour(s)).   Labs: Basic Metabolic Panel: Recent Labs  Lab 12/01/17 1541 12/02/17 0546  NA 139 135  K 4.0 3.9  CL 102 101  CO2 25 25  GLUCOSE 90 87  BUN 21* 15  CREATININE 0.90 0.53  CALCIUM 8.5* 8.1*   Liver Function Tests: No results for input(s): AST, ALT, ALKPHOS, BILITOT, PROT, ALBUMIN in the last 168 hours. No results for input(s): LIPASE, AMYLASE in the last 168 hours. No results for input(s): AMMONIA in the last 168 hours. CBC: Recent Labs  Lab 12/01/17 1541 12/02/17 0546  WBC 12.3* 10.5  NEUTROABS 5.2  --   HGB 8.1* 7.0*  HCT 27.5* 25.5*  MCV 57.4* 59.7*  PLT 552* 387   Cardiac Enzymes: Recent Labs  Lab 12/01/17 1541 12/02/17 0013 12/02/17 0546 12/02/17 1140  TROPONINI <0.03 <0.03 <0.03 <0.03   BNP: BNP (last 3 results) Recent Labs    12/01/17 1541  BNP 267.3*    ProBNP (last 3 results) No results for input(s): PROBNP in the last 8760 hours.  CBG: No  results for input(s): GLUCAP in the last 168 hours.     Signed:  Darlin Drop, MD [facility] 12/02/2017, 4:44 PM

## 2017-12-02 NOTE — Discharge Instructions (Addendum)
Venous Thromboembolism Prevention Venous thromboembolism (VTE) is a condition in which a blood clot (thrombus) develops in the body. A thrombus usually occurs in a deep vein in the leg or the pelvis (DVT), but it can also occur in the arm. Sometimes, pieces of a thrombus can break off from its original place of development and travel through the bloodstream to other parts of the body. When that happens, the thrombus is called an embolus. An embolus that travels to one or both lungs is called a pulmonary embolism. An embolism can block the blood flow in the blood vessels of other organs as well. VTE is a serious health condition that can cause disability or death. It is very important to get help right away and to not ignore symptoms. How can a VTE be prevented?  Exercise regularly. Take a brisk 30 minute walk every day. Staying active and moving around can help you to prevent blood clots.  Avoid sitting or lying in bed for long periods of time without moving your legs. Change your position often, especially during long-distance travel (over 4 hours).  If you are a woman who is over 86 years of age, avoid unnecessary use of medicines that contain estrogen. These include birth control pills and hormone replacement therapy.  Do not smoke, especially if you take estrogen medicines. If you need help quitting, ask your health care provider.  Eat plenty of fruits and vegetables. Ask your health care provider or dietitian if there are foods that you should avoid.  Maintain a weight that is appropriate for your height. Ask your health care provider what weight is healthy for you.  Wear loose-fitting clothing. Avoid constrictive or tight clothing around your legs or waist.  Try not to bump or injure your legs. Avoid crossing your legs when you are sitting.  Do not use pillows under your knees while lying down unless told by your health care provider.  Wear support hose (compression stockings or TED  hose) as told by your health care provider Compression stockings increase blood flow in your legs and can help prevent blood clots. Do not let them bunch up when you are wearing them. How can I prevent VTE when I travel? Long-distance travel (over 4 hours) can increase the risk of a VTE. To prevent VTE when traveling:  Exercise your legs every hour by standing, stretching, and bending and straightening your legs. If you are traveling by airplane, train, or bus, walk up and down the aisle as often as possible to get your blood moving. If you are traveling by car, stop and get out of the car every hour to exercise your legs and stretch. Other types of exercise might include: ? Keeping your feet flat on the ground and raising your toes. ? Switching from tightening the muscles in your calves and thighs to relaxing those same muscles while you are sitting. ? Pointing and flexing your feet at the ankle joints while you are sitting.  Stay well hydrated while traveling. Drink enough water to keep your urine clear or pale yellow.  Avoid drinking alcohol during long travel.  Generally, it is not recommended that you take medicines to prevent DVT during routine travel. How can VTE be prevented if I am hospitalized? A VTE may be prevented by taking medicines that are prescribed to prevent blood clots (anticoagulants). You can also help to prevent VTE while in the hospital by taking these actions:  Get out of bed and walk. Ask your health care  provider if this is safe for you to do.  Request the use of a sequential compression device (SCD). This is a machine that pumps air into compression sleeves that are wrapped around your legs.  Request the use of compression stockings, which are tight, elastic stockings that apply pressure to the lower legs. Compression stockings are sometimes used with SCDs.  How can I prevent VTE after surgery? Understand that there is an increased risk for VTE for the first 4-6  weeks after surgery. During this time:  Avoid long-distance travel (over 4 hours). If you must travel during this time, ask your health care provider about additional preventive actions that you can take. These might include exercising your arms and legs every hour while you travel.  Avoid sitting or lying still for too long. If possible, get up and walk around one time every hour. Ask your health care provider when this is safe for you to do.  Get help right away if:  You have new or increased pain, swelling, or redness in an arm or leg.  You have numbness or tingling in an arm or leg.  You have shortness of breath while active or at rest.  You have chest pain.  You have a rapid or irregular heartbeat.  You feel light-headed or dizzy.  You cough up blood.  You notice blood in your vomit, bowel movement, or urine. These symptoms may represent a serious problem that is an emergency. Do not wait to see if the symptoms will go away. Get medical help right away. Call your local emergency services (911 in the [place] Do not drive yourself to the hospital. This information is not intended to replace advice given to you by your health care provider. Make sure you discuss any questions you have with your health care provider. Document Released: 02/05/2009 Document Revised: 07/26/2015 Document Reviewed: 06/14/2014 Elsevier Interactive Patient Education  2018 [employer].  ______________________________________________________  Information on my medicine - XARELTO (rivaroxaban)  This medication education was reviewed with me or my healthcare representative as part of my discharge preparation.    WHY WAS XARELTO PRESCRIBED FOR YOU? Xarelto was prescribed to treat blood clots that may have been found in the veins of your legs (deep vein thrombosis) or in your lungs (pulmonary embolism) and to reduce the risk of them occurring again.  What do you need to know about Xarelto? The starting  dose is one 15 mg tablet taken TWICE daily with food for the FIRST 21 DAYS then on (enter date)  12/24/17  the dose is changed to one 20 mg tablet taken ONCE A DAY with your evening meal.  DO NOT stop taking Xarelto without talking to the health care provider who prescribed the medication.  Refill your prescription for 20 mg tablets before you run out.  After discharge, you should have regular check-up appointments with your healthcare provider that is prescribing your Xarelto.  In the future your dose may need to be changed if your kidney function changes by a significant amount.  What do you do if you miss a dose? If you are taking Xarelto TWICE DAILY and you miss a dose, take it as soon as you remember. You may take two 15 mg tablets (total 30 mg) at the same time then resume your regularly scheduled 15 mg twice daily the next day.  If you are taking Xarelto ONCE DAILY and you miss a dose, take it as soon as you remember on the same day  then continue your regularly scheduled once daily regimen the next day. Do not take two doses of Xarelto at the same time.   Important Safety Information Xarelto is a blood thinner medicine that can cause bleeding. You should call your healthcare provider right away if you experience any of the following: ? Bleeding from an injury or your nose that does not stop. ? Unusual colored urine (red or dark brown) or unusual colored stools (red or black). ? Unusual bruising for unknown reasons. ? A serious fall or if you hit your head (even if there is no bleeding).  Some medicines may interact with Xarelto and might increase your risk of bleeding while on Xarelto. To help avoid this, consult your healthcare provider or pharmacist prior to using any new prescription or non-prescription medications, including herbals, vitamins, non-steroidal anti-inflammatory drugs (NSAIDs) and supplements.  This website has more information on Xarelto: VisitDestination.com.br.

## 2017-12-02 NOTE — Progress Notes (Signed)
Patient was transfered from [facility]. Patient arrived via care link and was oriented to the unit. Patient is stable and admitting MD has been paged. Will continue to monitor.    Elsie Lincoln, RN

## 2017-12-02 NOTE — Consult Note (Signed)
[facility]  [phone]   HEMATOLOGY ONCOLOGY INPATIENT CONSULTATION   Sara Beltran  DOB: 08-Feb-1976  MR#: [id number]  CSN#: [id number]    Requesting Physician: [facility]  Patient Care Team: Patient, No Pcp Per as PCP - General (General Practice)  Reason for consult: PE and anemia   History of present illness: This is a 42 year old Caucasian female, with past medical history of iron deficiency anemia, ADD, currently on oral contraceptives for menorrhagia, presented with progressive weakness and shortness of breath, and one episode of chest pain.  He was admitted for a small left pulmonary embolism, and severe anemia.  She has had iron deficient anemia since her young age, especially during her pregnancy, she previously received IV iron several times, and was seen a hematologist with last visit 5-6 years ago.  She does not take oral iron, due to gastric discomfort and constipation.  Due to her heavy menstrual.,  She has been on oral contraceptives for several years, and she has menstrual period every 3 months.  No other medical signs of bleeding.  She has been extremely fatigued lately, and sometimes spend a whole day in the bed.  She developed chest tightness, and a left upper chest pain yesterday, and presented to the hospital.  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  His CBC on admission showed hemoglobin 8.1, MCV 57.4, and platelets of 552.  She was started on Lovenox injection.  Initially declined blood transfusion.  MEDICAL HISTORY:  Past Medical History:  Diagnosis Date  . Attention deficit disorder (ADD)   . Iron deficiency anemia 02/05/2011    SURGICAL HISTORY: Past Surgical History:  Procedure Laterality Date  . BREAST SURGERY    . tummy tuck      SOCIAL HISTORY: Social History   Socioeconomic History  . Marital status:  Married    Spouse name: Not on file  . Number of children: Not on file  . Years of education: Not on file  . Highest education level: Not on file  Occupational History  . Not on file  Social Needs  . Financial resource strain: Not on file  . Food insecurity:    Worry: Not on file    Inability: Not on file  . Transportation needs:    Medical: Not on file    Non-medical: Not on file  Tobacco Use  . Smoking status: Never Smoker  . Smokeless tobacco: Never Used  Substance and Sexual Activity  . Alcohol use: No  . Drug use: No  . Sexual activity: Yes    Birth control/protection: Pill  Lifestyle  . Physical activity:    Days per week: Not on file    Minutes per session: Not on file  . Stress: Not on file  Relationships  . Social connections:    Talks on phone: Not on file    Gets together: Not on file    Attends religious service: Not on file    Active member of club or organization: Not on file    Attends meetings of clubs or organizations: Not on file    Relationship status: Not on file  . Intimate partner violence:    Fear of current or ex partner: Not on file    Emotionally abused: Not on file    Physically abused: Not on file    Forced sexual activity: Not on file  Other Topics  Concern  . Not on file  Social History Narrative  . Not on file    FAMILY HISTORY: Family History  Problem Relation Age of Onset  . Diabetes Mellitus II Mother   . Heart disease Mother   . Heart disease Father   . Hypertension Father   . Hypertension Sister     ALLERGIES:  has No Known Allergies.  MEDICATIONS:  Current Facility-Administered Medications  Medication Dose Route Frequency Provider Last Rate Last Dose  . 0.9 %  sodium chloride infusion (Manually program via Guardrails IV Fluids)   Intravenous Once Darlin Drop, DO      . acetaminophen (TYLENOL) tablet 650 mg  650 mg Oral Q6H PRN Lorretta Harp, MD       Or  . acetaminophen (TYLENOL) suppository 650 mg  650 mg Rectal Q6H  PRN Lorretta Harp, MD      . albuterol (PROVENTIL) (2.5 MG/3ML) 0.083% nebulizer solution 2.5 mg  2.5 mg Nebulization Q4H PRN Lorretta Harp, MD      . amphetamine-dextroamphetamine (ADDERALL) tablet 20 mg  20 mg Oral BID Lorretta Harp, MD   20 mg at 12/02/17 1344  . dextromethorphan-guaiFENesin (MUCINEX DM) 30-600 MG per 12 hr tablet 1 tablet  1 tablet Oral BID PRN Lorretta Harp, MD      . ferrous sulfate tablet 325 mg  325 mg Oral Daily Lorretta Harp, MD   325 mg at 12/02/17 1345  . morphine 2 MG/ML injection 2 mg  2 mg Intravenous Q4H PRN Lorretta Harp, MD      . ondansetron [facility]) tablet 4 mg  4 mg Oral Q6H PRN Lorretta Harp, MD       Or  . ondansetron (ZOFRAN) injection 4 mg  4 mg Intravenous Q6H PRN Lorretta Harp, MD      . oxyCODONE-acetaminophen (PERCOCET/ROXICET) 5-325 MG per tablet 1 tablet  1 tablet Oral Q4H PRN Lorretta Harp, MD      . Rivaroxaban Carlena Hurl) tablet 15 mg  15 mg Oral BID Norva Pavlov, RPH       Followed by  . [START ON 12/23/2017] rivaroxaban (XARELTO) tablet 20 mg  20 mg Oral Q supper Norva Pavlov, RPH      . senna-docusate (Senokot-S) tablet 1 tablet  1 tablet Oral QHS PRN Lorretta Harp, MD      . zolpidem (AMBIEN) tablet 5 mg  5 mg Oral QHS PRN Lorretta Harp, MD        REVIEW OF SYSTEMS:   Constitutional: Denies fevers, chills or abnormal night sweats Eyes: Denies blurriness of vision, double vision or watery eyes Ears, nose, mouth, throat, and face: Denies mucositis or sore throat Respiratory: Denies cough, dyspnea or wheezes Cardiovascular: Denies palpitation, chest discomfort or lower extremity swelling Gastrointestinal:  Denies nausea, heartburn or change in bowel habits Skin: Denies abnormal skin rashes Lymphatics: Denies new lymphadenopathy or easy bruising Neurological:Denies numbness, tingling or new weaknesses Behavioral/Psych: Mood is stable, no new changes  All other systems were reviewed with the patient and are negative.  PHYSICAL EXAMINATION: ECOG  PERFORMANCE STATUS: 3 - Symptomatic, >50% confined to bed  Vitals:   12/02/17 1647 12/02/17 1735  BP: 136/86 127/78  Pulse: 97 (!) 103  Resp: 20 18  Temp: 98 F (36.7 C) 98.7 F (37.1 C)  SpO2: 99% 98%   Filed Weights   12/01/17 1433 12/01/17 2314 12/02/17 0449  Weight: 172 lb (78 kg) 167 lb 4.8 oz (75.9 kg) 167 lb 1.6 oz (75.8 kg)  GENERAL:alert, no distress and comfortable SKIN: skin color, texture, turgor are normal, no rashes or significant lesions EYES: normal, conjunctiva are pink and non-injected, sclera clear OROPHARYNX:no exudate, no erythema and lips, buccal mucosa, and tongue normal  NECK: supple, thyroid normal size, non-tender, without nodularity LYMPH:  no palpable lymphadenopathy in the cervical, axillary or inguinal LUNGS: clear to auscultation and percussion with normal breathing effort HEART: regular rate & rhythm and no murmurs and no lower extremity edema ABDOMEN:abdomen soft, non-tender and normal bowel sounds Musculoskeletal:no cyanosis of digits and no clubbing  PSYCH: alert & oriented x 3 with fluent speech NEURO: no focal motor/sensory deficits  LABORATORY DATA:  I have reviewed the data as listed Lab Results  Component Value Date   WBC 10.5 12/02/2017   HGB 7.0 (L) 12/02/2017   HCT 25.5 (L) 12/02/2017   MCV 59.7 (L) 12/02/2017   PLT 387 12/02/2017   Recent Labs    07/17/17 1410 12/01/17 1541 12/02/17 0546  NA 136 139 135  K 4.3 4.0 3.9  CL 102 102 101  CO2 23 25 25   GLUCOSE 98 90 87  BUN 12 21* 15  CREATININE 0.56 0.90 0.53  CALCIUM 9.0 8.5* 8.1*  GFRNONAA >60 >60 >60  GFRAA >60 >60 >60    RADIOGRAPHIC STUDIES: I have personally reviewed the radiological images as listed and agreed with the findings in the report. Ct Angio Chest Pe W And/or Wo Contrast  Result Date: 12/01/2017 CLINICAL DATA:  Left chest pain shortness of breath with exertion EXAM: CT ANGIOGRAPHY CHEST WITH CONTRAST TECHNIQUE: Multidetector CT imaging of the  chest was performed using the standard protocol during bolus administration of intravenous contrast. Multiplanar CT image reconstructions and MIPs were obtained to evaluate the vascular anatomy. CONTRAST:  ISOVUE-370 IOPAMIDOL (ISOVUE-370) INJECTION 76% COMPARISON:  Chest x-ray 07/17/2017 FINDINGS: Cardiovascular: Satisfactory opacification of the pulmonary arteries to the segmental level. Linear web like filling defects within left lower lobe subsegmental pulmonary branch vessels. No acute central embolus is seen. Nonaneurysmal aorta. Normal heart size. No pericardial effusion. Mediastinum/Nodes: No enlarged mediastinal, hilar, or axillary lymph nodes. Thyroid gland, trachea, and esophagus demonstrate no significant findings. Lungs/Pleura: Lungs are clear. No pleural effusion or pneumothorax. Upper Abdomen: No acute abnormality. Musculoskeletal: Bilateral breast implants. No acute or suspicious abnormality. Review of the MIP images confirms the above findings. IMPRESSION: 1. Small linear web like filling defects within subsegmental left lower lobe pulmonary branch vessels, thought to reflect sequela of chronic pulmonary emboli. No definite acute filling defects are visualized. 2. Clear lung fields Electronically Signed   By: Jasmine Pang M.D.   On: 12/01/2017 17:49   US Venous Img Upper Left (dvt Study)  Result Date: 12/01/2017 CLINICAL DATA:  Left upper extremity pain and discoloration EXAM: LEFT UPPER EXTREMITY VENOUS DOPPLER ULTRASOUND TECHNIQUE: Gray-scale sonography with graded compression, as well as color Doppler and duplex ultrasound were performed to evaluate the upper extremity deep venous system from the level of the subclavian vein and including the jugular, axillary, basilic, radial, ulnar and upper cephalic vein. Spectral Doppler was utilized to evaluate flow at rest and with distal augmentation maneuvers. COMPARISON:  None. FINDINGS: Contralateral Subclavian Vein: Respiratory phasicity is  normal and symmetric with the symptomatic side. No evidence of thrombus. Normal compressibility. Internal Jugular Vein: No evidence of thrombus. Normal compressibility, respiratory phasicity and response to augmentation. Subclavian Vein: No evidence of thrombus. Normal compressibility, respiratory phasicity and response to augmentation. Axillary Vein: No evidence of thrombus. Normal compressibility, respiratory  phasicity and response to augmentation. Cephalic Vein: No evidence of thrombus. Normal compressibility, respiratory phasicity and response to augmentation. Basilic Vein: No evidence of thrombus. Normal compressibility, respiratory phasicity and response to augmentation. Brachial Veins: No evidence of thrombus. Normal compressibility, respiratory phasicity and response to augmentation. Radial Veins: No evidence of thrombus. Normal compressibility, respiratory phasicity and response to augmentation. Ulnar Veins: No evidence of thrombus. Normal compressibility, respiratory phasicity and response to augmentation. Venous Reflux:  None visualized. Other Findings:  None visualized. IMPRESSION: No evidence of DVT within the left upper extremity. Electronically Signed   By: Judie Petit.  Shick M.D.   On: 12/01/2017 18:23    ASSESSMENT & PLAN:  42 year old Caucasian female, with past medical history of iron deficient anemia, presented with severe anemia and left chest pain.  1. 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 Moderate to severe symptomatic anemia, likely iron deficiency from menorrhagia and possible poor absorption 3. Dyspnea and chest pain, secondary to #1 and #2  Recommendations: -Given her severe symptomatic anemia, I agree with blood transfusion, discussed with patient, and she agreed. -Her today's ferritin was 2, significantly elevated TIBC, transferrin saturation 8%, this is consistent with severe iron deficiency. -I recommend IV Feraheme 510mg  in the hospital, and I will set up her outpatient follow-up for  second dose in a week -We discussed the benefit of anticoagulation for his small PE.  She has been on oral contraceptives, which is a high risk for thrombosis, this is probable provoked.  Recommend anticoagulation with Xarelto for 3 months. -We discussed the side effect of anticoagulation, especially bleeding.  She knows her menorrhagia is likely getting worse when she is on Xarelto, and I told her to hold Xarelto when she has menstrual period. I strongly encouraged her to discuss with her gynecologist alternative treatment options for her menorrhagia, such as ablation or non hormonal-containing IUD.  She agrees. -I will see her back in my clinic in a week  -OK to discharge after blood transfusion and iv iron.  -She had hyper coagulopathy work-up in the ED yesterday, results are pending, I will discuss with her on her follow-up in my clinic.   All questions were answered. The patient knows to call the clinic with any problems, questions or concerns.      Malachy Mood, MD 12/02/2017 5:57 PM

## 2017-12-02 NOTE — Progress Notes (Signed)
  Echocardiogram 2D Echocardiogram has been performed.  Geraldin Habermehl L Androw 12/02/2017, 9:59 AM

## 2017-12-02 NOTE — Progress Notes (Signed)
Patient receiving first unit of blood. And tolerating well no s/s of reaction. And has started first dose of xarelto 15mg . Need assistance for medication cost.

## 2017-12-02 NOTE — Progress Notes (Signed)
LE venous duplex prelim: negative for DVT. Kandy Towery Eunice, RDMS, RVT  

## 2017-12-03 DIAGNOSIS — I2782 Chronic pulmonary embolism: Secondary | ICD-10-CM

## 2017-12-03 DIAGNOSIS — F988 Other specified behavioral and emotional disorders with onset usually occurring in childhood and adolescence: Secondary | ICD-10-CM | POA: Diagnosis not present

## 2017-12-03 DIAGNOSIS — I509 Heart failure, unspecified: Secondary | ICD-10-CM | POA: Diagnosis not present

## 2017-12-03 LAB — BPAM RBC
BLOOD PRODUCT EXPIRATION DATE: 201910252359
Blood Product Expiration Date: 201910252359
ISSUE DATE / TIME: 201910021807
ISSUE DATE / TIME: 201910022315
UNIT TYPE AND RH: 6200
UNIT TYPE AND RH: 6200

## 2017-12-03 LAB — HEMOGLOBIN A1C
Hgb A1c MFr Bld: 5.7 % — ABNORMAL HIGH (ref 4.8–5.6)
Mean Plasma Glucose: 117 mg/dL

## 2017-12-03 LAB — TYPE AND SCREEN
ABO/RH(D): A POS
ANTIBODY SCREEN: NEGATIVE
UNIT DIVISION: 0
Unit division: 0

## 2017-12-03 LAB — HOMOCYSTEINE: Homocysteine: 10.4 umol/L (ref 0.0–15.0)

## 2017-12-03 LAB — CBC
HEMATOCRIT: 36.1 % (ref 36.0–46.0)
Hemoglobin: 10.6 g/dL — ABNORMAL LOW (ref 12.0–15.0)
MCH: 18.8 pg — ABNORMAL LOW (ref 26.0–34.0)
MCHC: 29.4 g/dL — ABNORMAL LOW (ref 30.0–36.0)
MCV: 64.1 fL — AB (ref 78.0–100.0)
PLATELETS: 448 10*3/uL — AB (ref 150–400)
RBC: 5.63 MIL/uL — AB (ref 3.87–5.11)
RDW: 24.8 % — ABNORMAL HIGH (ref 11.5–15.5)
WBC: 12.2 10*3/uL — AB (ref 4.0–10.5)

## 2017-12-03 LAB — PROTEIN C ACTIVITY: Protein C Activity: 113 % (ref 73–180)

## 2017-12-03 LAB — PROTEIN S ACTIVITY: PROTEIN S ACTIVITY: 53 % — AB (ref 63–140)

## 2017-12-03 LAB — CARDIOLIPIN ANTIBODIES, IGG, IGM, IGA
Anticardiolipin IgA: <9 APL U/mL (ref 0–11)
Anticardiolipin IgG: <9 GPL U/mL (ref 0–14)
Anticardiolipin IgM: <9 MPL U/mL (ref 0–12)

## 2017-12-03 LAB — LUPUS ANTICOAGULANT PANEL
DRVVT: 34.7 s (ref 0.0–47.0)
PTT LA: 31.2 s (ref 0.0–51.9)

## 2017-12-03 LAB — PROTEIN S, TOTAL: PROTEIN S AG TOTAL: 44 % — AB (ref 60–150)

## 2017-12-03 MED ORDER — FERROUS SULFATE 325 (65 FE) MG PO TABS: 325.0000 mg | ORAL_TABLET | Freq: Two times a day (BID) | ORAL | 0 refills | Status: DC

## 2017-12-03 MED ORDER — RIVAROXABAN (XARELTO) VTE STARTER PACK (15 & 20 MG): ORAL_TABLET | ORAL | 0 refills | Status: DC

## 2017-12-03 MED ORDER — FERROUS SULFATE 325 (65 FE) MG PO TABS: 325.0000 mg | ORAL_TABLET | Freq: Two times a day (BID) | ORAL | 0 refills | Status: AC

## 2017-12-03 MED FILL — FERROUS SULFATE 325 MG TAB: 325 (65 FE) | 100 days supply | Qty: 100 | Fill #0

## 2017-12-03 MED FILL — XARELTO STARTER PACK: 15 & 20 | 30 days supply | Qty: 51 | Fill #0

## 2017-12-03 NOTE — Discharge Summary (Signed)
Discharge Summary  [name] ZOX:[id number] DOB: 04-23-1975  PCP: Patient, No Pcp Per  Admit date: 12/01/2017 Discharge date: 12/03/2017  Time spent: 25 minutes  UPDATE: Discharge delayed due to blood transfusion which the patient initially declined.  12/03/17: Seen and examined with her husband at her bedside. States she feels better this morning. Dyspnea and fatigue are resolving. Patient counseled on the importance of following up with OBGYN and hematologist Dr Mosetta Putt and to comply with her treatment xarelto and iron supplement. Patient understands and agrees to plan.  Recommendations for Outpatient Follow-up:  1. Follow-up with your OB/GYN 2. Follow-up with hematology Dr. Mosetta Putt 3. Take your medications as prescribed  Discharge Diagnoses:  Active Hospital Problems   Diagnosis Date Noted  . Chronic pulmonary embolism (HCC) 12/01/2017  . Leukocytosis 12/01/2017  . ADD (attention deficit disorder) 05/16/2013  . Iron deficiency anemia 02/05/2011    Resolved Hospital Problems  No resolved problems to display.    Discharge Condition: Stable  Diet recommendation: Resume previous diet  Vitals:   12/03/17 0743 12/03/17 0800  BP:  130/75  Pulse: (!) 103 75  Resp:  16  Temp:  98.3 F (36.8 C)  SpO2: 97% 100%    History of present illness:   Sara Beltran is a 42 y.o. female with medical history significant of iron deficiency anemia, ADD, currently taking oral contraceptive pills, who presents with shortness breath and chest pain.  Patient states that she has been having shortness of breath for more than 6 months.  It is associated with intermittent chest pain.  The chest pain is located in the left side of chest, sharp, burning-like pain, radiating to the left arm, it can reach 8 out of 10 severity, currently 4 out of 10 severity today.  Both chest pain or shortness breath are exertional.  Denies long distant traveling or tenderness in the calf areas.  Patient has dry cough, but  no fever or chills.  Patient was seen by PCP, and started her with Augmentin, prednisone and inhaler on 9/25, without significant help. Patient does not have nausea, vomiting, diarrhea or abdominal pain pain no symptoms of UTI or unilateral weakness.  Patient is taking OCP pills currently. Pt reports mild left forearm and hand swelling recently.  12/02/2017: Patient seen and examined at bedside.  States she is not currently on her menses.  Low hemoglobin this morning 7.0 from 8.1.  Persistent dyspnea on ambulation.  Initially refused blood transfusion now agreeable after discussing with her husband.  Patient states she was on oral contraception due to heavy menses.  Thoughts are that OCP provoked her pulmonary embolism.  Patient advised not to resume OCP until otherwise recommended by hematology and gynecology.  Patient understands and agrees to plan.  Hematologist Dr. Mosetta Putt consulted and saw the patient. Will continue to follow outpatient. Possible Iron infusion outpatient per Dr Mosetta Putt.  Hospital Course:  Principal Problem:   Chronic pulmonary embolism (HCC) Active Problems:   Iron deficiency anemia   ADD (attention deficit disorder)   Leukocytosis  Chronic pulmonary embolism (HCC): No oxygen desaturation.  Patient has elevated BNP 267, but does not have any leg edema or JVD, no pulmonary edema on chest x-ray.  Clinically does not have acute CHF.  PE is likely provoked by OCP use.  Currently hemodynamically stable.  Patient does not have fever or leukocytosis, clinically does not have pneumonia or bacterial bronchitis.  Will discontinue Augmentin. -Lovenox was initiated per pharm; DC full dose lovenox and start xarelto -  2D echocardiogram negative for right heart strain.  Preserved LVEF. -LE dopplers negative for acute DVT -trop x 3 all negative -d/c OCP -Hematologist Dr. Mosetta Putt consulted and will continue to follow with the patient outpatient. -Continue Xarelto x 3 months until otherwise recommended  by hematology  Iron deficiency anemia/symptomatic anemia: Hemoglobin 8.7 on 07/17/2017--> 8.1---> 7.0.  -Fatigue and dyspnea with minimal exertion -2 unit PRBCs ordered to be transfused -No active bleeding tendency. -Continue iron supplement 325 mg BID -MCV 57 initially now 64 -Hg 10.3 post 2U PRBCs transfusion -Possible iron infusion outpatient per Dr Mosetta Putt  ADD (attention deficit disorder): -Self reported has not taken Adderall for at least 1 year  Leukocytosis: No fever or signs of infection.  Likely reactive due to steroid-induced demargination.    Procedures:  None  Consultations:  Hematology  Discharge Exam: BP 130/75 (BP Location: Left Arm)   Pulse 75   Temp 98.3 F (36.8 C) (Oral)   Resp 16   Ht 5\' 6"  (1.676 m)   Wt 73.8 kg   LMP 11/04/2017   SpO2 100%   BMI 26.26 kg/m  . General: 42 y.o. year-old female well developed well nourished in no acute distress. A&O x 3. . Cardiovascular: RRR no rubs or gallops. No JVD or thyromegaly..   . Respiratory: Clear to auscultation with no wheezes or rales. Good inspiratory effort. . Abdomen: Soft nontender nondistended with normal bowel sounds x4 quadrants. . Musculoskeletal: No lower extremity edema. 2/4 pulses in all 4 extremities. . Skin: No ulcerative lesions noted or rashes, . Psychiatry: Mood is appropriate for condition and setting  Discharge Instructions You were cared for by a hospitalist during your hospital stay. If you have any questions about your discharge medications or the care you received while you were in the hospital after you are discharged, you can call the unit and asked to speak with the hospitalist on call if the hospitalist that took care of you is not available. Once you are discharged, your primary care physician will handle any further medical issues. Please note that NO REFILLS for any discharge medications will be authorized once you are discharged, as it is imperative that you return to your  primary care physician (or establish a relationship with a primary care physician if you do not have one) for your aftercare needs so that they can reassess your need for medications and monitor your lab values.   Allergies as of 12/03/2017   No Known Allergies     Medication List    STOP taking these medications   amoxicillin-clavulanate 875-125 MG tablet Commonly known as:  AUGMENTIN   amphetamine-dextroamphetamine 20 MG tablet Commonly known as:  ADDERALL   Norethindrone Acetate-Ethinyl Estradiol 1.5-30 MG-MCG tablet Commonly known as:  JUNEL,LOESTRIN,MICROGESTIN   norethindrone-ethinyl estradiol-iron 1.5-30 MG-MCG tablet Commonly known as:  MICROGESTIN FE,GILDESS FE,LOESTRIN FE   predniSONE 10 MG (21) Tbpk tablet Commonly known as:  STERAPRED UNI-PAK 21 TAB     TAKE these medications   albuterol 108 (90 Base) MCG/ACT inhaler Commonly known as:  PROVENTIL HFA;VENTOLIN HFA Inhale 2 puffs into the lungs every 6 (six) hours as needed for wheezing or shortness of breath.   ferrous sulfate 325 (65 FE) MG tablet Take 1 tablet (325 mg total) by mouth 2 (two) times daily with a meal. What changed:  when to take this   Rivaroxaban 15 & 20 MG Tbpk Take as directed on package: Start with one 15mg  tablet by mouth twice a day with food.  On Day 22, switch to one 20mg  tablet once a day with food.      No Known Allergies Follow-up Information    Malachy Mood, MD. Call in 1 day(s).   Specialties:  Hematology, Oncology Why:  Please call for a post hospital follow-up appointment. Contact information: [address] [place] [address] [phone]         [facility]. Call in 1 day(s).   Why:  Please call for a post hospital follow-up appointment. Contact information: [address] [place] [address] [phone]           The results of significant diagnostics from this hospitalization (including imaging,  microbiology, ancillary and laboratory) are listed below for reference.    Significant Diagnostic Studies: Ct Angio Chest Pe W And/or Wo Contrast  Result Date: 12/01/2017 CLINICAL DATA:  Left chest pain shortness of breath with exertion EXAM: CT ANGIOGRAPHY CHEST WITH CONTRAST TECHNIQUE: Multidetector CT imaging of the chest was performed using the standard protocol during bolus administration of intravenous contrast. Multiplanar CT image reconstructions and MIPs were obtained to evaluate the vascular anatomy. CONTRAST:  ISOVUE-370 IOPAMIDOL (ISOVUE-370) INJECTION 76% COMPARISON:  Chest x-ray 07/17/2017 FINDINGS: Cardiovascular: Satisfactory opacification of the pulmonary arteries to the segmental level. Linear web like filling defects within left lower lobe subsegmental pulmonary branch vessels. No acute central embolus is seen. Nonaneurysmal aorta. Normal heart size. No pericardial effusion. Mediastinum/Nodes: No enlarged mediastinal, hilar, or axillary lymph nodes. Thyroid gland, trachea, and esophagus demonstrate no significant findings. Lungs/Pleura: Lungs are clear. No pleural effusion or pneumothorax. Upper Abdomen: No acute abnormality. Musculoskeletal: Bilateral breast implants. No acute or suspicious abnormality. Review of the MIP images confirms the above findings. IMPRESSION: 1. Small linear web like filling defects within subsegmental left lower lobe pulmonary branch vessels, thought to reflect sequela of chronic pulmonary emboli. No definite acute filling defects are visualized. 2. Clear lung fields Electronically Signed   By: Jasmine Pang M.D.   On: 12/01/2017 17:49   US Venous Img Upper Left (dvt Study)  Result Date: 12/01/2017 CLINICAL DATA:  Left upper extremity pain and discoloration EXAM: LEFT UPPER EXTREMITY VENOUS DOPPLER ULTRASOUND TECHNIQUE: Gray-scale sonography with graded compression, as well as color Doppler and duplex ultrasound were performed to evaluate the upper  extremity deep venous system from the level of the subclavian vein and including the jugular, axillary, basilic, radial, ulnar and upper cephalic vein. Spectral Doppler was utilized to evaluate flow at rest and with distal augmentation maneuvers. COMPARISON:  None. FINDINGS: Contralateral Subclavian Vein: Respiratory phasicity is normal and symmetric with the symptomatic side. No evidence of thrombus. Normal compressibility. Internal Jugular Vein: No evidence of thrombus. Normal compressibility, respiratory phasicity and response to augmentation. Subclavian Vein: No evidence of thrombus. Normal compressibility, respiratory phasicity and response to augmentation. Axillary Vein: No evidence of thrombus. Normal compressibility, respiratory phasicity and response to augmentation. Cephalic Vein: No evidence of thrombus. Normal compressibility, respiratory phasicity and response to augmentation. Basilic Vein: No evidence of thrombus. Normal compressibility, respiratory phasicity and response to augmentation. Brachial Veins: No evidence of thrombus. Normal compressibility, respiratory phasicity and response to augmentation. Radial Veins: No evidence of thrombus. Normal compressibility, respiratory phasicity and response to augmentation. Ulnar Veins: No evidence of thrombus. Normal compressibility, respiratory phasicity and response to augmentation. Venous Reflux:  None visualized. Other Findings:  None visualized. IMPRESSION: No evidence of DVT within the left upper extremity. Electronically Signed   By: Judie Petit.  Shick M.D.  On: 12/01/2017 18:23    Microbiology: No results found for this or any previous visit (from the past 240 hour(s)).   Labs: Basic Metabolic Panel: Recent Labs  Lab 12/01/17 1541 12/02/17 0546  NA 139 135  K 4.0 3.9  CL 102 101  CO2 25 25  GLUCOSE 90 87  BUN 21* 15  CREATININE 0.90 0.53  CALCIUM 8.5* 8.1*   Liver Function Tests: No results for input(s): AST, ALT, ALKPHOS, BILITOT, PROT,  ALBUMIN in the last 168 hours. No results for input(s): LIPASE, AMYLASE in the last 168 hours. No results for input(s): AMMONIA in the last 168 hours. CBC: Recent Labs  Lab 12/01/17 1541 12/02/17 0546 12/03/17 0538  WBC 12.3* 10.5 12.2*  NEUTROABS 5.2  --   --   HGB 8.1* 7.0* 10.6*  HCT 27.5* 25.5* 36.1  MCV 57.4* 59.7* 64.1*  PLT 552* 387 448*   Cardiac Enzymes: Recent Labs  Lab 12/01/17 1541 12/02/17 0013 12/02/17 0546 12/02/17 1140  TROPONINI <0.03 <0.03 <0.03 <0.03   BNP: BNP (last 3 results) Recent Labs    12/01/17 1541  BNP 267.3*    ProBNP (last 3 results) No results for input(s): PROBNP in the last 8760 hours.  CBG: No results for input(s): GLUCAP in the last 168 hours.     Signed:  Darlin Drop, MD [facility] 12/03/2017, 9:59 AM

## 2017-12-03 NOTE — Evaluation (Signed)
Physical Therapy Evaluation/ Discharge Patient Details Name: Sara Beltran MRN: [id number] DOB: 30-Nov-1975 Today's Date: 12/03/2017   History of Present Illness  42 yo with weakness, SOB, chronic PE. PMHx: anemia, tummy tuck  Clinical Impression  Pt very pleasant, moving well and reports LUE pain and numbness but no other difficulties at this time with mobility. Pt has been having progressive fatigue for 6 months with difficulty at times with stairs and housework. Currenlty able to perform all basic mobility and gait without assist with HR 106-120 with gait and SpO2 95-97% on RA. Pt encouraged to perform energy conservation and  continue ambulation and progressive activity without further therapy needs at this time, will sign off with pt aware and agreeable.     Follow Up Recommendations No PT follow up    Equipment Recommendations  None recommended by PT    Recommendations for Other Services       Precautions / Restrictions Precautions Precautions: None      Mobility  Bed Mobility Overal bed mobility: Independent                Transfers Overall transfer level: Independent Equipment used: None                Ambulation/Gait Ambulation/Gait assistance: Independent Gait Distance (Feet): 400 Feet Assistive device: None Gait Pattern/deviations: WFL(Within Functional Limits)   Gait velocity interpretation: >4.37 ft/sec, indicative of normal walking speed General Gait Details: no LOB with SpO2 maintained above 95% throughout, good speed and stability  Stairs Stairs: Yes Stairs assistance: Independent Stair Management: Alternating pattern;Forwards Number of Stairs: 8    Wheelchair Mobility    Modified Rankin (Stroke Patients Only)       Balance Overall balance assessment: No apparent balance deficits (not formally assessed)                                           Pertinent Vitals/Pain Pain Assessment: 0-10 Pain Score: 4  Pain  Location: LUE numbness and tingling Pain Descriptors / Indicators: Burning;Shooting Pain Intervention(s): Limited activity within patient's tolerance    Home Living Family/patient expects to be discharged to:: Private residence Living Arrangements: Spouse/significant other Available Help at Discharge: Family;Available 24 hours/day Type of Home: Apartment Home Access: Stairs to enter   Entrance Stairs-Number of Steps: 14 Home Layout: One level Home Equipment: None      Prior Function Level of Independence: Independent               Hand Dominance        Extremity/Trunk Assessment   Upper Extremity Assessment Upper Extremity Assessment: Overall WFL for tasks assessed    Lower Extremity Assessment Lower Extremity Assessment: Overall WFL for tasks assessed    Cervical / Trunk Assessment Cervical / Trunk Assessment: Normal  Communication   Communication: No difficulties  Cognition Arousal/Alertness: Awake/alert Behavior During Therapy: WFL for tasks assessed/performed Overall Cognitive Status: Within Functional Limits for tasks assessed                                        General Comments      Exercises     Assessment/Plan    PT Assessment Patent does not need any further PT services  PT Problem List  PT Treatment Interventions      PT Goals (Current goals can be found in the Care Plan section)  Acute Rehab PT Goals Patient Stated Goal: return to gym PT Goal Formulation: All assessment and education complete, DC therapy    Frequency     Barriers to discharge        Co-evaluation               AM-PAC PT "6 Clicks" Daily Activity  Outcome Measure Difficulty turning over in bed (including adjusting bedclothes, sheets and blankets)?: None Difficulty moving from lying on back to sitting on the side of the bed? : None Difficulty sitting down on and standing up from a chair with arms (e.g., wheelchair, bedside  commode, etc,.)?: None Help needed moving to and from a bed to chair (including a wheelchair)?: None Help needed walking in hospital room?: None Help needed climbing 3-5 steps with a railing? : None 6 Click Score: 24    End of Session Equipment Utilized During Treatment: Gait belt Activity Tolerance: Patient tolerated treatment well Patient left: in bed;with call bell/phone within reach;with family/visitor present Nurse Communication: Mobility status PT Visit Diagnosis: Pain Pain - Right/Left: Left Pain - part of body: Arm    Time: 4098-1191 PT Time Calculation (min) (ACUTE ONLY): 12 min   Charges:   PT Evaluation $PT Eval Low Complexity: 1 Low          Sara Beltran, PT Acute Rehabilitation Services Pager: [phone] Office: [phone]   Sara Beltran 12/03/2017, 7:54 AM

## 2017-12-03 NOTE — Progress Notes (Signed)
patient is discharged waiting on transitional care to delivery of new ordered medication.

## 2017-12-03 NOTE — Care Management Note (Signed)
Case Management Note  Patient Details  Name: Sara Beltran MRN: [id number] Date of Birth: 1975/09/07  Subjective/Objective:     PE,  Iron deficiency anemia, symptomatic anemia              Action/Plan: Spoke to pt and husband at bedside. Pt states she was going to [facility] but her PCP left. States she will be looking for a new PCP. Explained she can call [facility] on Mon at 830 am to arrange hospital follow up appt. She will be seeing Dr Mosetta Putt at [facility]. Contacted [facility] and Xarelto will be delivered to room.   Expected Discharge Date:  12/03/17               Expected Discharge Plan:  Home/Self Care  In-House Referral:  NA  Discharge planning Services  CM Consult, Medication Assistance  Post Acute Care Choice:  NA Choice offered to:  NA  DME Arranged:  N/A DME Agency:  NA  HH Arranged:  NA HH Agency:  NA  Status of Service:  Completed, signed off  If discussed at Long Length of Stay Meetings, dates discussed:    Additional Comments:  Elliot Cousin, RN 12/03/2017, 10:27 AM

## 2017-12-04 ENCOUNTER — Other Ambulatory Visit: Payer: Self-pay | Admitting: Hematology

## 2017-12-04 LAB — BETA-2-GLYCOPROTEIN I ABS, IGG/M/A: Beta-2-Glycoprotein I IgM: <9 GPI IgM units (ref 0–32)

## 2017-12-04 LAB — PROTEIN C, TOTAL: PROTEIN C, TOTAL: 101 % (ref 60–150)

## 2017-12-07 LAB — PROTHROMBIN GENE MUTATION

## 2017-12-07 LAB — FACTOR 5 LEIDEN

## 2017-12-08 ENCOUNTER — Encounter: Payer: Self-pay | Admitting: Hematology

## 2017-12-08 ENCOUNTER — Telehealth: Payer: Self-pay | Admitting: Hematology

## 2017-12-08 NOTE — Telephone Encounter (Signed)
Appt scheduled per 10/8 staff message from [facility]

## 2017-12-08 NOTE — Telephone Encounter (Signed)
A hospital follow up appointment has been scheduled for the pt to see Dr. Mosetta Putt on 10/18 at 230pm followed by an IV feraheme infusion at 3pm. When I attempted to call the pt, her vm was full. Letter mailed.

## 2017-12-09 ENCOUNTER — Telehealth: Payer: Self-pay | Admitting: Hematology

## 2017-12-09 NOTE — Telephone Encounter (Signed)
Returned call to patient and adivsed her of upcoming appointments

## 2017-12-17 ENCOUNTER — Other Ambulatory Visit: Payer: Self-pay | Admitting: Hematology

## 2017-12-18 ENCOUNTER — Inpatient Hospital Stay: Payer: Medicaid Other

## 2017-12-18 ENCOUNTER — Encounter: Payer: Self-pay | Admitting: Hematology

## 2017-12-18 ENCOUNTER — Inpatient Hospital Stay: Payer: Medicaid Other | Attending: Hematology | Admitting: Hematology

## 2017-12-18 VITALS — BP 131/69 | HR 86 | Temp 98.5°F | Resp 18

## 2017-12-18 VITALS — BP 147/78 | HR 103 | Temp 98.4°F | Resp 18 | Ht 66.0 in | Wt 175.1 lb

## 2017-12-18 DIAGNOSIS — R531 Weakness: Secondary | ICD-10-CM | POA: Diagnosis not present

## 2017-12-18 DIAGNOSIS — D509 Iron deficiency anemia, unspecified: Secondary | ICD-10-CM

## 2017-12-18 DIAGNOSIS — I2782 Chronic pulmonary embolism: Secondary | ICD-10-CM | POA: Diagnosis not present

## 2017-12-18 DIAGNOSIS — F988 Other specified behavioral and emotional disorders with onset usually occurring in childhood and adolescence: Secondary | ICD-10-CM

## 2017-12-18 DIAGNOSIS — N920 Excessive and frequent menstruation with regular cycle: Secondary | ICD-10-CM | POA: Diagnosis not present

## 2017-12-18 DIAGNOSIS — Z79899 Other long term (current) drug therapy: Secondary | ICD-10-CM | POA: Diagnosis not present

## 2017-12-18 MED ORDER — SODIUM CHLORIDE 0.9 % IV SOLN
Freq: Once | INTRAVENOUS | Status: AC
  Administered 2017-12-18: 15:00:00 via INTRAVENOUS
  Filled 2017-12-18: qty 250

## 2017-12-18 MED ORDER — SODIUM CHLORIDE 0.9 % IV SOLN
510.0000 mg | Freq: Once | INTRAVENOUS | Status: AC
  Administered 2017-12-18: 510 mg via INTRAVENOUS
  Filled 2017-12-18: qty 17

## 2017-12-18 NOTE — Patient Instructions (Signed)

## 2017-12-18 NOTE — Progress Notes (Signed)
[facility]  [phone] [phone]  Clinic Follow up Note   Patient Care Team: Patient, No Pcp Per as PCP - General (General Practice)   Date of Service:  12/18/2017  CC:  F/u of Anemia and PE  History of present illness: 12/02/17 This is a 42 year old Caucasian female, with past medical history of iron deficiency anemia, ADD, currently on oral contraceptives for menorrhagia, presented with progressive weakness and shortness of breath, and one episode of chest pain.  He was admitted for a small left pulmonary embolism, and severe anemia.  She has had iron deficient anemia since her young age, especially during her pregnancy, she previously received IV iron several times, and was seen a hematologist with last visit 5-6 years ago.  She does not take oral iron, due to gastric discomfort and constipation.  Due to her heavy menstrual.,  She has been on oral contraceptives for several years, and she has menstrual period every 3 months.  No other medical signs of bleeding.  She has been extremely fatigued lately, and sometimes spend a whole day in the bed.  She developed chest tightness, and a left upper chest pain yesterday, and presented to the hospital.  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  His CBC on admission showed hemoglobin 8.1, MCV 57.4, and platelets of 552.  She was started on Lovenox injection.  Initially declined blood transfusion.  CURRENT THERAPY: IV feraheme as needed, oral iron   INTERVAL HISTORY:  Sara Beltran is here for a follow up of her anemia and PE. This is my first time seeing her as outpatient since she was discharged from hospital on 12/03/17.   She presents to the clinic today by herself. She notes she feels better including her SOB. She has not returned to work or the gym since them. She has been using electronic watch  to monitor her heart rate. She now finds it easier to move around. She notes she is tired throughout the day but able to get what she needs done.  She is interested in medication to help her lose weight. Will wait until blood counts settle.  She is taking Ferrous Sulfate BID. She is on new birth control progesterone. She plans to have uterine ablation in 3 months with her Gyn. She is no longer on Adderall, but would like another medication to help with her energy.     REVIEW OF SYSTEMS:    Constitutional: Denies fevers, chills or abnormal weight loss Eyes: Denies blurriness of vision Ears, nose, mouth, throat, and face: Denies mucositis or sore throat Respiratory: Denies cough, dyspnea or wheezes Cardiovascular: Denies palpitation, chest discomfort or lower extremity swelling Gastrointestinal:  Denies nausea, heartburn or change in bowel habits Skin: Denies abnormal skin rashes Lymphatics: Denies new lymphadenopathy or easy bruising Neurological:Denies numbness, tingling or new weaknesses Behavioral/Psych: Mood is stable, no new changes  All other systems were reviewed with the patient and are negative.  MEDICAL HISTORY:  Past Medical History:  Diagnosis Date  . Attention deficit disorder (ADD)   . Iron deficiency anemia 02/05/2011    SURGICAL HISTORY: Past Surgical History:  Procedure Laterality Date  . BREAST SURGERY    . tummy tuck      I have reviewed the social history and family history with the patient and they are unchanged from previous note.  ALLERGIES:  has No Known Allergies.  MEDICATIONS:  Current Outpatient Medications  Medication Sig Dispense Refill  . ferrous sulfate 325 (65 FE) MG tablet Take 1 tablet (325 mg total) by mouth 2 (two) times daily with a meal. 60 tablet 0  . medroxyPROGESTERone (PROVERA) 10 MG tablet Take 10 mg by mouth daily.    . Rivaroxaban 15 & 20 MG TBPK Take as directed on package: Start with one 15mg  tablet by mouth twice a day with food.  On Day 22, switch to one 20mg  tablet once a day with food. 51 each 0  . albuterol (PROVENTIL HFA;VENTOLIN HFA) 108 (90 Base) MCG/ACT inhaler Inhale 2 puffs into the lungs every 6 (six) hours as needed for wheezing or shortness of breath.     No current facility-administered medications for this visit.     PHYSICAL EXAMINATION: ECOG PERFORMANCE STATUS: 1 - Symptomatic but completely ambulatory  Vitals:   12/18/17 1501  BP: (!) 147/78  Pulse: (!) 103  Resp: 18  Temp: 98.4 F (36.9 C)  SpO2: 99%   Filed Weights   12/18/17 1501  Weight: 175 lb 1.6 oz (79.4 kg)    GENERAL:alert, no distress and comfortable SKIN: skin color, texture, turgor are normal, no rashes or significant lesions EYES: normal, Conjunctiva are pink and non-injected, sclera clear OROPHARYNX:no exudate, no erythema and lips, buccal mucosa, and tongue normal  NECK: supple, thyroid normal size, non-tender, without nodularity LYMPH:  no palpable lymphadenopathy in the cervical, axillary or inguinal LUNGS: clear to auscultation and percussion with normal breathing effort HEART: regular rate & rhythm and no murmurs and no lower extremity edema ABDOMEN:abdomen soft, non-tender and normal bowel sounds Musculoskeletal:no cyanosis of digits and no clubbing  NEURO: alert & oriented x 3 with fluent speech, no focal motor/sensory deficits  LABORATORY DATA:  I have reviewed the data as listed CBC Latest Ref Rng & Units 12/03/2017 12/02/2017 12/01/2017  WBC 4.0 - 10.5 K/uL 12.2(H) 10.5 12.3(H)  Hemoglobin 12.0 - 15.0 g/dL 10.6(L) 7.0(L) 8.1(L)  Hematocrit 36.0 - 46.0 % 36.1 25.5(L) 27.5(L)  Platelets 150 - 400 K/uL 448(H) 387 552(H)     CMP Latest Ref Rng & Units 12/02/2017 12/01/2017 07/17/2017  Glucose 70 - 99 mg/dL 87 90 98  BUN 6 - 20 mg/dL 15 14(N) 12  Creatinine 0.44 - 1.00 mg/dL 8.29 5.62 1.30  Sodium 135 - 145 mmol/L 135 139 136  Potassium 3.5 - 5.1 mmol/L 3.9 4.0 4.3  Chloride 98 - 111 mmol/L 101 102 102  CO2 22  - 32 mmol/L 25 25 23   Calcium 8.9 - 10.3 mg/dL 8.1(L) 8.5(L) 9.0  Total Protein 6.0 - 8.3 g/dL - - -  Total Bilirubin 0.3 - 1.2 mg/dL - - -  Alkaline Phos 39 - 117 U/L - - -  AST 0 - 37 U/L - - -  ALT 0 - 35 U/L - - -   Iron Study Panel  Results for ROXSANA, RIDING (MRN [id number]) as of 12/18/2017 11:06  Ref. Range 12/02/2017 15:21  Iron Latest Ref Range: 28 - 170 ug/dL 40  UIBC Latest Units: ug/dL 295  TIBC Latest Ref Range: 250 - 450 ug/dL 284 (H)  Saturation Ratios Latest Ref Range: 10.4 - 31.8 % 8 (L)  Ferritin Latest Ref Range: 11 - 307 ng/mL 2 (L)     Coagulation Workup Results for CHANNON, BROUGHER (MRN [id number]) as of 12/18/2017 11:06  Ref. Range 12/01/2017 15:41 12/01/2017 21:32  Anticardiolipin Ab,IgA,Qn Latest Ref Range: 0 - 11 APL U/mL  <9  Anticardiolipin Ab,IgG,Qn  Latest Ref Range: 0 - 14 GPL U/mL  <9  Anticardiolipin Ab,IgM,Qn Latest Ref Range: 0 - 12 MPL U/mL  <9  PTT Lupus Anticoagulant Latest Ref Range: 0.0 - 51.9 sec  31.2  DRVVT Latest Ref Range: 0.0 - 47.0 sec  34.7  Lupus Anticoag Interp Unknown  Comment:  Beta-2 Glycoprotein I Ab, IgG Latest Ref Range: 0 - 20 GPI IgG units  <9  Beta-2-Glycoprotein I IgA Latest Ref Range: 0 - 25 GPI IgA units  <9  Beta-2-Glycoprotein I IgM Latest Ref Range: 0 - 32 GPI IgM units  <9  Antithrombin Activity Latest Ref Range: 75 - 120 %  101  D-Dimer, Quant Latest Ref Range: 0.00 - 0.50 ug/mL-FEU 0.59 (H)   Recommendations-F5LEID: Unknown  Comment  Recommendations-PTGENE: Unknown  Comment  Protein C-Functional Latest Ref Range: 73 - 180 %  113  Protein C, Total Latest Ref Range: 60 - 150 %  101  Protein S-Functional Latest Ref Range: 63 - 140 %  53 (L)  Protein S, Total Latest Ref Range: 60 - 150 %  44 (L)     RADIOGRAPHIC STUDIES: I have personally reviewed the radiological images as listed and agreed with the findings in the report. No results found.   ASSESSMENT & PLAN:  42 y.o. Caucasian female, with past medical history of  iron deficient and anemia and ADD.    1. 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 Her Hypercoagulation workup from 12/01/17 showed low protein S, but otherwise negative.  -She has been on oral contraceptives, which is a high risk for thrombosis, this is probable provoked.  -We previously discussed the benefit of anticoagulation for her small PE. I recommended anticoagulation with Xarelto for 3 months starting 12/03/17. She is tolerating well  -We previously discussed the side effect of anticoagulation, especially bleeding. She knows her menorrhagia is likely getting worse when she is on Xarelto, and I told her to hold Xarelto when she has menstrual period. -She has changed her Birth control to Provera and plans to get uterine ablation in about 3 months.  -I will repeat protein S level after she come off Xarelto    2. Moderate to severe symptomatic anemia, secondary to iron deficiency from menorrhagia and possible poor absorption -Her Hospital labs from 12/02/17 shows ferritin was 2, significantly elevated TIBC, transferrin saturation 8%, this is consistent with severe iron deficiency. -She received a blood transfusion on 12/04/17  -She will receive 2 doses of weekly IV Feraheme starting today. I reveiwed the small risk for allergy reaction. She previously had IV Feraheme in 2014 with no reaction, will monitor.  -Will repeat labs next week -She changed her birth control to Provera  -She plans to have uterine ablation in 3 months with Gyn -Continue Oral Ferrous Sulfate BID    3. Dyspnea and chest pain, secondary to #1 and #2 -Resolved after hospitalization -Her BP was elevated today at 147/78 (12/18/17), I encouraged to monitor her BP at home daily.     PLAN:  -IV Feraheme today  -Lab and iv iron in one week  -Lab in 1 month  -Lab and f/u in 2 months    No problem-specific Assessment & Plan notes found for this encounter.   No orders  of the defined types were placed in this encounter.  All questions were answered. The patient knows to call the clinic with any problems, questions or concerns. No barriers to learning was detected.  I spent 15 minutes counseling the patient face to face. The total time spent in the appointment was 20 minutes and more than 50% was on counseling and review of test results     Malachy Mood, MD 12/18/2017 5:34 PM   I, Delphina Cahill, am acting as scribe for Malachy Mood, MD.   I have reviewed the above documentation for accuracy and completeness, and I agree with the above.

## 2017-12-29 ENCOUNTER — Other Ambulatory Visit: Payer: Self-pay

## 2017-12-29 ENCOUNTER — Inpatient Hospital Stay: Payer: Medicaid Other

## 2017-12-29 ENCOUNTER — Inpatient Hospital Stay: Payer: Medicaid Other | Admitting: *Deleted

## 2017-12-29 ENCOUNTER — Other Ambulatory Visit: Payer: Self-pay | Admitting: Hematology

## 2017-12-29 VITALS — BP 118/61 | HR 85 | Temp 98.7°F | Resp 18

## 2017-12-29 DIAGNOSIS — D509 Iron deficiency anemia, unspecified: Secondary | ICD-10-CM

## 2017-12-29 LAB — IRON AND TIBC
Iron: 88 ug/dL (ref 41–142)
Saturation Ratios: 29 % (ref 21–57)
TIBC: 309 ug/dL (ref 236–444)
UIBC: 221 ug/dL (ref 120–384)

## 2017-12-29 LAB — CBC WITH DIFFERENTIAL (CANCER CENTER ONLY)
Abs Immature Granulocytes: 0.01 10*3/uL (ref 0.00–0.07)
BASOS PCT: 1 %
Basophils Absolute: 0 10*3/uL (ref 0.0–0.1)
EOS ABS: 0.1 10*3/uL (ref 0.0–0.5)
EOS PCT: 2 %
HEMATOCRIT: 38.1 % (ref 36.0–46.0)
HEMOGLOBIN: 11.8 g/dL — AB (ref 12.0–15.0)
Immature Granulocytes: 0 %
Lymphocytes Relative: 33 %
Lymphs Abs: 1.4 10*3/uL (ref 0.7–4.0)
MCH: 23 pg — AB (ref 26.0–34.0)
MCHC: 31 g/dL (ref 30.0–36.0)
MCV: 74.3 fL — AB (ref 80.0–100.0)
MONO ABS: 0.6 10*3/uL (ref 0.1–1.0)
Monocytes Relative: 13 %
Neutro Abs: 2.2 10*3/uL (ref 1.7–7.7)
Neutrophils Relative %: 51 %
Platelet Count: 406 10*3/uL — ABNORMAL HIGH (ref 150–400)
RBC: 5.13 MIL/uL — ABNORMAL HIGH (ref 3.87–5.11)
WBC Count: 4.3 10*3/uL (ref 4.0–10.5)
nRBC: 0 % (ref 0.0–0.2)

## 2017-12-29 LAB — FERRITIN: Ferritin: 282 ng/mL (ref 11–307)

## 2017-12-29 MED ORDER — SODIUM CHLORIDE 0.9 % IV SOLN
510.0000 mg | Freq: Once | INTRAVENOUS | Status: AC
  Administered 2017-12-29: 510 mg via INTRAVENOUS
  Filled 2017-12-29: qty 17

## 2017-12-29 MED ORDER — SODIUM CHLORIDE 0.9 % IV SOLN
Freq: Once | INTRAVENOUS | Status: AC
  Administered 2017-12-29: 14:00:00 via INTRAVENOUS
  Filled 2017-12-29: qty 250

## 2017-12-29 NOTE — Patient Instructions (Signed)

## 2018-01-04 ENCOUNTER — Telehealth: Payer: Self-pay

## 2018-01-04 NOTE — Telephone Encounter (Signed)
-----   Message from Malachy Mood, MD sent at 01/02/2018 10:26 AM EDT ----- Please let pt know the lab result, anemia near resolved, iron normal, no need iv iron for now, thanks   Malachy Mood  01/02/2018

## 2018-01-04 NOTE — Telephone Encounter (Signed)
Spoke with patient regarding lab results, per Dr. Mosetta Putt, anemia near resolved, iron normal, no need for IV iron for now, patient verbalized an understanding and states she feels a lot better.

## 2018-01-08 ENCOUNTER — Telehealth: Payer: Self-pay

## 2018-01-08 ENCOUNTER — Other Ambulatory Visit: Payer: Self-pay | Admitting: Hematology

## 2018-01-08 DIAGNOSIS — I2782 Chronic pulmonary embolism: Secondary | ICD-10-CM

## 2018-01-08 MED ORDER — RIVAROXABAN 20 MG PO TABS: 20.0000 mg | ORAL_TABLET | Freq: Every day | ORAL | 4 refills | Status: DC

## 2018-01-08 NOTE — Telephone Encounter (Signed)
Patient calls for refill on Xarelto.  Was prescribed by doctor in the hospital.  Wants it sent into [facility].

## 2018-01-08 NOTE — Telephone Encounter (Signed)
I have refilled, thanks   Rashed Edler MD  

## 2018-01-25 ENCOUNTER — Other Ambulatory Visit: Payer: Self-pay

## 2018-01-25 DIAGNOSIS — D509 Iron deficiency anemia, unspecified: Secondary | ICD-10-CM

## 2018-01-26 ENCOUNTER — Inpatient Hospital Stay: Payer: Medicaid Other | Attending: Hematology

## 2018-01-26 DIAGNOSIS — D509 Iron deficiency anemia, unspecified: Secondary | ICD-10-CM | POA: Diagnosis not present

## 2018-01-26 LAB — CBC WITH DIFFERENTIAL (CANCER CENTER ONLY)
Abs Immature Granulocytes: 0.02 10*3/uL (ref 0.00–0.07)
BASOS ABS: 0 10*3/uL (ref 0.0–0.1)
BASOS PCT: 0 %
EOS ABS: 0.1 10*3/uL (ref 0.0–0.5)
Eosinophils Relative: 2 %
HCT: 43.4 % (ref 36.0–46.0)
Hemoglobin: 14.3 g/dL (ref 12.0–15.0)
IMMATURE GRANULOCYTES: 0 %
LYMPHS ABS: 1.8 10*3/uL (ref 0.7–4.0)
Lymphocytes Relative: 23 %
MCH: 25.4 pg — AB (ref 26.0–34.0)
MCHC: 32.9 g/dL (ref 30.0–36.0)
MCV: 77 fL — ABNORMAL LOW (ref 80.0–100.0)
Monocytes Absolute: 1 10*3/uL (ref 0.1–1.0)
Monocytes Relative: 12 %
NEUTROS PCT: 63 %
Neutro Abs: 5 10*3/uL (ref 1.7–7.7)
Platelet Count: 272 10*3/uL (ref 150–400)
RBC: 5.64 MIL/uL — AB (ref 3.87–5.11)
RDW: 22 % — ABNORMAL HIGH (ref 11.5–15.5)
WBC Count: 8 10*3/uL (ref 4.0–10.5)
nRBC: 0 % (ref 0.0–0.2)

## 2018-01-27 LAB — IRON AND TIBC
Iron: 60 ug/dL (ref 41–142)
SATURATION RATIOS: 22 % (ref 21–57)
TIBC: 270 ug/dL (ref 236–444)
UIBC: 210 ug/dL (ref 120–384)

## 2018-01-27 LAB — FERRITIN: FERRITIN: 251 ng/mL (ref 11–307)

## 2018-01-29 ENCOUNTER — Telehealth: Payer: Self-pay

## 2018-01-29 NOTE — Telephone Encounter (Signed)
Attempted to call patient per Dr. Mosetta PuttFeng to let her know her lab results show that her anemia has resolved, iron study is normal, no concerns.  Phone rang but stated her mailbox was full unable to leave a message.

## 2018-01-29 NOTE — Telephone Encounter (Signed)
-----   Message from Malachy MoodYan Feng, MD sent at 01/28/2018 11:53 PM EST ----- Please let pt know her lab results, anemia resolved, iron study is normal, no concerns, thanks   Malachy MoodYan Feng  01/28/2018

## 2018-02-22 ENCOUNTER — Inpatient Hospital Stay: Payer: Medicaid Other | Admitting: Hematology

## 2018-02-22 ENCOUNTER — Inpatient Hospital Stay: Payer: Medicaid Other | Attending: Hematology

## 2018-03-10 ENCOUNTER — Telehealth: Payer: Self-pay | Admitting: Hematology

## 2018-03-10 NOTE — Telephone Encounter (Signed)
Called patient per 1/7 sch message - unable to reach patient no answer and vmail full

## 2018-04-14 ENCOUNTER — Telehealth: Payer: Self-pay

## 2018-04-14 NOTE — Telephone Encounter (Signed)
Spoke with patient concerning her request to reschedule her appointment. Mailed a letter with a calender enclosed of this appointment. Per 2/12 return phone msg calls.

## 2018-04-30 NOTE — Progress Notes (Signed)
[facility]   Telephone:([phone]) [phone] [phone]) [phone]   Clinic Follow up Note   Patient Care Team: Patient, No Pcp Per as PCP - General (General Practice)  Date of Service:  05/03/2018  CHIEF COMPLAINT: F/u of Anemia and PE   CURRENT THERAPY:  IV feraheme as needed, oral iron   INTERVAL HISTORY:  Sara Beltran is here for a follow up of anemia and PE. She presents to the clinic today by herself. She notes she is doing well but still very tired. She has started feeling more SOB and chest pain with exertion of walking up stairs and carrying her 60 pound dog. This has been getting worse over the past week.  She has been on Xarelto since the end of 12/2017. She notes 3 months ago she started Provera and since then has not had a period.    REVIEW OF SYSTEMS:   Constitutional: Denies fevers, chills or abnormal weight loss (+) Fatigue  Eyes: Denies blurriness of vision Ears, nose, mouth, throat, and face: Denies mucositis or sore throat Respiratory: Denies cough or wheezes (+) SOB upon exertion Cardiovascular: Denies palpitation or lower extremity swelling (+) chest pain  Gastrointestinal:  Denies nausea, heartburn or change in bowel habits Skin: Denies abnormal skin rashes Lymphatics: Denies new lymphadenopathy or easy bruising Neurological:Denies numbness, tingling or new weaknesses Behavioral/Psych: Mood is stable, no new changes  All other systems were reviewed with the patient and are negative.  MEDICAL HISTORY:  Past Medical History:  Diagnosis Date  . Attention deficit disorder (ADD)   . Iron deficiency anemia 02/05/2011    SURGICAL HISTORY: Past Surgical History:  Procedure Laterality Date  . BREAST SURGERY    . tummy tuck      I have reviewed the social history and family history with the patient and they are unchanged from previous note.  ALLERGIES:  has No Known Allergies.  MEDICATIONS:  Current Outpatient Medications  Medication Sig Dispense  Refill  . ferrous sulfate 325 (65 FE) MG tablet Take 1 tablet (325 mg total) by mouth 2 (two) times daily with a meal. 60 tablet 0  . medroxyPROGESTERone (PROVERA) 10 MG tablet Take 10 mg by mouth daily.    Marland Kitchen oxybutynin (DITROPAN-XL) 5 MG 24 hr tablet Take 5 mg by mouth daily.    . rivaroxaban (XARELTO) 20 MG TABS tablet Take 1 tablet (20 mg total) by mouth daily with supper. 30 tablet 4  . albuterol (PROVENTIL HFA;VENTOLIN HFA) 108 (90 Base) MCG/ACT inhaler Inhale 2 puffs into the lungs every 6 (six) hours as needed for wheezing or shortness of breath.     No current facility-administered medications for this visit.     PHYSICAL EXAMINATION: ECOG PERFORMANCE STATUS: 1 - Symptomatic but completely ambulatory  Vitals:   05/03/18 1118  BP: 138/83  Pulse: (!) 103  Resp: 18  Temp: 98 F (36.7 C)  SpO2: 98%   Filed Weights   05/03/18 1118  Weight: 175 lb 12.8 oz (79.7 kg)    GENERAL:alert, no distress and comfortable SKIN: skin color, texture, turgor are normal, no rashes or significant lesions EYES: normal, Conjunctiva are pink and non-injected, sclera clear OROPHARYNX:no exudate, no erythema and lips, buccal mucosa, and tongue normal  NECK: supple, thyroid normal size, non-tender, without nodularity LYMPH:  no palpable lymphadenopathy in the cervical, axillary or inguinal LUNGS: clear to auscultation and percussion with normal breathing effort HEART: regular rate & rhythm and no murmurs and no lower extremity edema ABDOMEN:abdomen soft,  non-tender and normal bowel sounds Musculoskeletal:no cyanosis of digits and no clubbing  NEURO: alert & oriented x 3 with fluent speech, no focal motor/sensory deficits  LABORATORY DATA:  I have reviewed the data as listed CBC Latest Ref Rng & Units 05/03/2018 01/26/2018 12/29/2017  WBC 4.0 - 10.5 K/uL 5.7 8.0 4.3  Hemoglobin 12.0 - 15.0 g/dL 59.9 77.4 11.8(L)  Hematocrit 36.0 - 46.0 % 41.1 43.4 38.1  Platelets 150 - 400 K/uL 259 272 406(H)      CMP Latest Ref Rng & Units 12/02/2017 12/01/2017 07/17/2017  Glucose 70 - 99 mg/dL 87 90 98  BUN 6 - 20 mg/dL 15 14(E) 12  Creatinine 0.44 - 1.00 mg/dL 3.95 3.20 2.33  Sodium 135 - 145 mmol/L 135 139 136  Potassium 3.5 - 5.1 mmol/L 3.9 4.0 4.3  Chloride 98 - 111 mmol/L 101 102 102  CO2 22 - 32 mmol/L 25 25 23   Calcium 8.9 - 10.3 mg/dL 8.1(L) 8.5(L) 9.0  Total Protein 6.0 - 8.3 g/dL - - -  Total Bilirubin 0.3 - 1.2 mg/dL - - -  Alkaline Phos 39 - 117 U/L - - -  AST 0 - 37 U/L - - -  ALT 0 - 35 U/L - - -      RADIOGRAPHIC STUDIES: I have personally reviewed the radiological images as listed and agreed with the findings in the report. No results found.   ASSESSMENT & PLAN:  Sara Beltran is a 43 y.o. female with   1. 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  -She has been on oral contraceptives, which is a high risk for thrombosis,this is probable provoked. -She has been on anticoagulation with Xarelto since 12/03/17. She is tolerating well.  -She has worsened chest pain and SOB in the past week. Will repeat CT chest to rule out residual blood clots before stopping Xarelto.  -Will return 1 month after cessation to repeat anticoagulant panel.  -F/u in 7 weeks.   2. Moderate to severe symptomatic anemia, secondary to iron deficiencyfrom menorrhagia and possible poor absorption -Her Hospital labs from 12/02/17 shows ferritin was 2, significantly elevated TIBC, transferrin saturation 8%, this is consistent with severe iron deficiency. -She received a blood transfusion and 2 doses of IV Iron in 12/2017  -She changed her birth control to Provera, no period since starting.  -She underwent uterine ablation with Gyn on 02/16/19 -Hg normal, Iron panel still pending. (05/03/18) -Continue Oral Ferrous Sulfate once daily. If she continues to not have a period, she is fine to stop oral iron.   3. Dyspnea and  chest pain, secondary to #1 and #2  -Worsened in the past week.  -Physical exam normal today (05/03/18) -Will do CT chest to rule out residual blood clots.  -Will monitor  PLAN:  -Continue Xarelto until I contact her to stop  -CTA chest to rule out residual PE in 1-2 weeks, if negative will stop Xarelto  -Lab in 6 weeks and f/u one week after lab with hypercoagulopathy work up     No problem-specific Assessment & Plan notes found for this encounter.   Orders Placed This Encounter  Procedures  . CT ANGIO CHEST PE W OR WO CONTRAST    CTA protocol to rule out PE    Standing Status:   Future    Standing Expiration Date:   08/04/2019    Order Specific Question:   If indicated for the  ordered procedure, I authorize the administration of contrast media per Radiology protocol    Answer:   Yes    Order Specific Question:   Is patient pregnant?    Answer:   No    Order Specific Question:   Preferred imaging location?    Answer:   [facility]    Order Specific Question:   Radiology Contrast Protocol - do NOT remove file path    Answer:   [email or web address]  . Antithrombin III  . Protein C activity  . Protein C, total  . Protein S activity  . Protein S, total  . Lupus anticoagulant panel  . Beta-2-glycoprotein i abs, IgG/M/A  . Homocysteine, serum  . Factor 5 leiden  . Prothrombin gene mutation  . Cardiolipin antibodies, IgG, IgM, IgA   All questions were answered. The patient knows to call the clinic with any problems, questions or concerns. No barriers to learning was detected. I spent 20 minutes counseling the patient face to face. The total time spent in the appointment was 25 minutes and more than 50% was on counseling and review of test results     Malachy Mood, MD 05/03/2018   I, Delphina Cahill, am acting as scribe for Malachy Mood, MD.   I have reviewed the above documentation for accuracy and completeness, and I agree with the above.

## 2018-05-03 ENCOUNTER — Telehealth: Payer: Self-pay | Admitting: Hematology

## 2018-05-03 ENCOUNTER — Other Ambulatory Visit: Payer: Self-pay

## 2018-05-03 ENCOUNTER — Inpatient Hospital Stay: Payer: Medicaid Other | Attending: Hematology | Admitting: Hematology

## 2018-05-03 ENCOUNTER — Inpatient Hospital Stay: Payer: Medicaid Other

## 2018-05-03 VITALS — BP 138/83 | HR 103 | Temp 98.0°F | Resp 18 | Ht 66.0 in | Wt 175.8 lb

## 2018-05-03 DIAGNOSIS — Z79899 Other long term (current) drug therapy: Secondary | ICD-10-CM | POA: Diagnosis not present

## 2018-05-03 DIAGNOSIS — Z7901 Long term (current) use of anticoagulants: Secondary | ICD-10-CM | POA: Insufficient documentation

## 2018-05-03 DIAGNOSIS — I2782 Chronic pulmonary embolism: Secondary | ICD-10-CM | POA: Diagnosis not present

## 2018-05-03 DIAGNOSIS — F988 Other specified behavioral and emotional disorders with onset usually occurring in childhood and adolescence: Secondary | ICD-10-CM | POA: Diagnosis not present

## 2018-05-03 DIAGNOSIS — Z793 Long term (current) use of hormonal contraceptives: Secondary | ICD-10-CM | POA: Insufficient documentation

## 2018-05-03 DIAGNOSIS — D509 Iron deficiency anemia, unspecified: Secondary | ICD-10-CM | POA: Diagnosis not present

## 2018-05-03 LAB — CBC WITH DIFFERENTIAL (CANCER CENTER ONLY)
Abs Immature Granulocytes: 0.02 10*3/uL (ref 0.00–0.07)
Basophils Absolute: 0 10*3/uL (ref 0.0–0.1)
Basophils Relative: 1 %
EOS ABS: 0.1 10*3/uL (ref 0.0–0.5)
EOS PCT: 3 %
HEMATOCRIT: 41.1 % (ref 36.0–46.0)
HEMOGLOBIN: 14 g/dL (ref 12.0–15.0)
Immature Granulocytes: 0 %
LYMPHS PCT: 40 %
Lymphs Abs: 2.3 10*3/uL (ref 0.7–4.0)
MCH: 28.1 pg (ref 26.0–34.0)
MCHC: 34.1 g/dL (ref 30.0–36.0)
MCV: 82.5 fL (ref 80.0–100.0)
MONO ABS: 0.4 10*3/uL (ref 0.1–1.0)
Monocytes Relative: 8 %
NRBC: 0 % (ref 0.0–0.2)
Neutro Abs: 2.8 10*3/uL (ref 1.7–7.7)
Neutrophils Relative %: 48 %
Platelet Count: 259 10*3/uL (ref 150–400)
RBC: 4.98 MIL/uL (ref 3.87–5.11)
RDW: 12.1 % (ref 11.5–15.5)
WBC: 5.7 10*3/uL (ref 4.0–10.5)

## 2018-05-03 LAB — IRON AND TIBC
IRON: 108 ug/dL (ref 41–142)
SATURATION RATIOS: 38 % (ref 21–57)
TIBC: 283 ug/dL (ref 236–444)
UIBC: 175 ug/dL (ref 120–384)

## 2018-05-03 LAB — FERRITIN: Ferritin: 139 ng/mL (ref 11–307)

## 2018-05-03 NOTE — Telephone Encounter (Signed)
Scheduled per los, declined printout  ° °

## 2018-05-04 ENCOUNTER — Encounter: Payer: Self-pay | Admitting: Hematology

## 2018-05-04 NOTE — Addendum Note (Signed)
Addended by: Malachy Mood on: 05/04/2018 12:55 PM   Modules accepted: Orders

## 2018-05-10 ENCOUNTER — Telehealth: Payer: Self-pay

## 2018-05-10 NOTE — Telephone Encounter (Signed)
-----   Message from Malachy Mood, MD sent at 05/08/2018  9:57 AM EST ----- Please let pt know her iron study was normal. No concerns, thanks   Malachy Mood  05/08/2018

## 2018-05-10 NOTE — Telephone Encounter (Signed)
Spoke with patient regarding lab results, per Dr. Mosetta Putt iron study was normal, no concerns, patient verbalized an understanding.

## 2018-05-14 ENCOUNTER — Ambulatory Visit (HOSPITAL_COMMUNITY)
Admission: RE | Admit: 2018-05-14 | Discharge: 2018-05-14 | Disposition: A | Discharge status: Home / Self Care | Payer: Medicaid Other | Source: Ambulatory Visit | Attending: Hematology | Admitting: Hematology

## 2018-05-14 ENCOUNTER — Other Ambulatory Visit: Payer: Self-pay

## 2018-05-14 DIAGNOSIS — D509 Iron deficiency anemia, unspecified: Secondary | ICD-10-CM | POA: Insufficient documentation

## 2018-05-14 MED ORDER — SODIUM CHLORIDE (PF) 0.9 % IJ SOLN
INTRAMUSCULAR | Status: AC
  Filled 2018-05-14: qty 50

## 2018-05-14 MED ORDER — IOHEXOL 350 MG/ML SOLN
100.0000 mL | Freq: Once | INTRAVENOUS | Status: AC | PRN
  Administered 2018-05-14: 100 mL via INTRAVENOUS

## 2018-05-23 ENCOUNTER — Other Ambulatory Visit: Payer: Self-pay | Admitting: Hematology

## 2018-06-09 ENCOUNTER — Telehealth: Payer: Self-pay

## 2018-06-09 NOTE — Telephone Encounter (Signed)
Spoke with patient regarding CT results, per Dr. Mosetta Putt CT was good, okay to stop taking the Xarelto, also we will move her existing appointments for April to 4 to 8 weeks out, the patient verbalized an understanding and was in agreement with the plan.  Scheduling message was sent.

## 2018-06-09 NOTE — Telephone Encounter (Signed)
-----   Message from Malachy Mood, MD sent at 06/08/2018  5:57 PM EDT ----- Elnita Maxwell,  I am not sure if we called pt after her CT scan on 3/13. If not, tell her know CT was good and OK to stop Xarelto. She is scheduled for lab on 4/13 and OV on 4/20. OK to postpone lab and f/u for 4-8 weeks (lab one week before OV), thanks   Terrace Arabia

## 2018-06-14 ENCOUNTER — Telehealth: Payer: Self-pay | Admitting: Hematology

## 2018-06-14 ENCOUNTER — Inpatient Hospital Stay: Payer: Medicaid Other

## 2018-06-14 NOTE — Telephone Encounter (Signed)
April appointments cancelled per 4/8 schedule message. Spoke with patient re new appointments for June.

## 2018-06-21 ENCOUNTER — Ambulatory Visit: Payer: Medicaid Other | Admitting: Hematology

## 2018-08-02 ENCOUNTER — Inpatient Hospital Stay: Payer: Medicaid Other | Attending: Hematology

## 2018-08-02 ENCOUNTER — Telehealth: Payer: Self-pay | Admitting: Hematology

## 2018-08-02 NOTE — Telephone Encounter (Signed)
Tried to call pt per 6/1 sch message - no answer and unable to leave message due to vmail being full

## 2018-08-08 ENCOUNTER — Other Ambulatory Visit: Payer: Self-pay | Admitting: Hematology

## 2018-08-08 DIAGNOSIS — I2782 Chronic pulmonary embolism: Secondary | ICD-10-CM

## 2018-08-09 ENCOUNTER — Inpatient Hospital Stay: Payer: Medicaid Other | Admitting: Hematology

## 2018-09-07 ENCOUNTER — Other Ambulatory Visit: Payer: Self-pay | Admitting: *Deleted

## 2018-09-07 ENCOUNTER — Ambulatory Visit (HOSPITAL_COMMUNITY)
Admission: RE | Admit: 2018-09-07 | Discharge: 2018-09-07 | Disposition: A | Discharge status: Home / Self Care | Payer: Medicaid Other | Source: Ambulatory Visit | Attending: Nurse Practitioner | Admitting: Nurse Practitioner

## 2018-09-07 ENCOUNTER — Other Ambulatory Visit: Payer: Self-pay

## 2018-09-07 ENCOUNTER — Telehealth: Payer: Self-pay | Admitting: *Deleted

## 2018-09-07 DIAGNOSIS — I2782 Chronic pulmonary embolism: Secondary | ICD-10-CM | POA: Diagnosis present

## 2018-09-07 NOTE — Progress Notes (Signed)
Right lower extremity venous duplex completed. Refer to "CV Proc" under chart review to view preliminary results.  Attempted to call report to [phone], however there was no answer. LMM and discharged patient.  09/07/2018 3:47 PM Maudry Mayhew, MHA, RVT, RDCS, RDMS

## 2018-09-07 NOTE — Telephone Encounter (Signed)
Received message from Vascular lab/[facility]na @ 3: 44 pm noting that doppler was negative for DVT.  Called pt & informed.  She wanted to know what she could do for the pain.  Discussed NSAIDs or  Tylenol & warm compresses.  She expressed understanding.  Message sent to schedulers to r/s missed appts.

## 2018-09-08 ENCOUNTER — Telehealth: Payer: Self-pay | Admitting: Hematology

## 2018-09-08 NOTE — Telephone Encounter (Signed)
R/s appt per 7/7 sch message - pt aware of new appt date and time  

## 2018-09-16 ENCOUNTER — Inpatient Hospital Stay: Payer: Medicaid Other | Attending: Hematology

## 2018-09-16 ENCOUNTER — Other Ambulatory Visit: Payer: Self-pay

## 2018-09-16 DIAGNOSIS — Z7901 Long term (current) use of anticoagulants: Secondary | ICD-10-CM | POA: Insufficient documentation

## 2018-09-16 DIAGNOSIS — D509 Iron deficiency anemia, unspecified: Secondary | ICD-10-CM | POA: Insufficient documentation

## 2018-09-16 DIAGNOSIS — R0600 Dyspnea, unspecified: Secondary | ICD-10-CM | POA: Insufficient documentation

## 2018-09-16 DIAGNOSIS — Z79899 Other long term (current) drug therapy: Secondary | ICD-10-CM | POA: Insufficient documentation

## 2018-09-16 DIAGNOSIS — R079 Chest pain, unspecified: Secondary | ICD-10-CM | POA: Diagnosis not present

## 2018-09-16 DIAGNOSIS — Z86711 Personal history of pulmonary embolism: Secondary | ICD-10-CM | POA: Insufficient documentation

## 2018-09-16 DIAGNOSIS — F988 Other specified behavioral and emotional disorders with onset usually occurring in childhood and adolescence: Secondary | ICD-10-CM | POA: Diagnosis not present

## 2018-09-16 DIAGNOSIS — I2782 Chronic pulmonary embolism: Secondary | ICD-10-CM

## 2018-09-16 LAB — CBC WITH DIFFERENTIAL (CANCER CENTER ONLY)
Abs Immature Granulocytes: 0.01 10*3/uL (ref 0.00–0.07)
Basophils Absolute: 0 10*3/uL (ref 0.0–0.1)
Basophils Relative: 1 %
Eosinophils Absolute: 0.2 10*3/uL (ref 0.0–0.5)
Eosinophils Relative: 3 %
HCT: 43.6 % (ref 36.0–46.0)
Hemoglobin: 14.9 g/dL (ref 12.0–15.0)
Immature Granulocytes: 0 %
Lymphocytes Relative: 35 %
Lymphs Abs: 2 10*3/uL (ref 0.7–4.0)
MCH: 27.3 pg (ref 26.0–34.0)
MCHC: 34.2 g/dL (ref 30.0–36.0)
MCV: 80 fL (ref 80.0–100.0)
Monocytes Absolute: 0.6 10*3/uL (ref 0.1–1.0)
Monocytes Relative: 11 %
Neutro Abs: 2.9 10*3/uL (ref 1.7–7.7)
Neutrophils Relative %: 50 %
Platelet Count: 274 10*3/uL (ref 150–400)
RBC: 5.45 MIL/uL — ABNORMAL HIGH (ref 3.87–5.11)
RDW: 12 % (ref 11.5–15.5)
WBC Count: 5.8 10*3/uL (ref 4.0–10.5)
nRBC: 0 % (ref 0.0–0.2)

## 2018-09-16 LAB — IRON AND TIBC
Iron: 79 ug/dL (ref 41–142)
Saturation Ratios: 25 % (ref 21–57)
TIBC: 321 ug/dL (ref 236–444)
UIBC: 242 ug/dL (ref 120–384)

## 2018-09-16 LAB — FERRITIN: Ferritin: 112 ng/mL (ref 11–307)

## 2018-09-17 NOTE — Progress Notes (Signed)
[facility]   [phone] [phone]   Clinic Follow up Note   Patient Care Team: Sherald Hess., MD as PCP - General (Family Medicine)  Date of Service:  09/23/2018  CHIEF COMPLAINT: F/u of Anemia and PE   CURRENT THERAPY:  IV feraheme as needed, oral iron   INTERVAL HISTORY:  Sara Beltran is here for a follow up of anemia and PE. She presents to the clinic alone. She was last seen by me in 05/2018. She notes she is doing well. She notes her PCP restarted her adderall 10mg  BID. She feels better since being on it again. She notes she stopped Xarelto.  She notes she has not had a period since 01/2018 due to provera. She plans to have her Uterine ablation still. She has stopped oral iron.    REVIEW OF SYSTEMS:   Constitutional: Denies fevers, chills or abnormal weight loss Eyes: Denies blurriness of vision Ears, nose, mouth, throat, and face: Denies mucositis or sore throat Respiratory: Denies cough, dyspnea or wheezes Cardiovascular: Denies palpitation, chest discomfort or lower extremity swelling Gastrointestinal:  Denies nausea, heartburn or change in bowel habits Skin: Denies abnormal skin rashes Lymphatics: Denies new lymphadenopathy or easy bruising Neurological:Denies numbness, tingling or new weaknesses Behavioral/Psych: Mood is stable, no new changes  All other systems were reviewed with the patient and are negative.  MEDICAL HISTORY:  Past Medical History:  Diagnosis Date  . Attention deficit disorder (ADD)   . Iron deficiency anemia 02/05/2011    SURGICAL HISTORY: Past Surgical History:  Procedure Laterality Date  . BREAST SURGERY    . tummy tuck      I have reviewed the social history and family history with the patient and they are unchanged from previous note.  ALLERGIES:  has No Known Allergies.  MEDICATIONS:  Current Outpatient Medications  Medication Sig Dispense Refill  . amphetamine-dextroamphetamine  (ADDERALL) 10 MG tablet Take 10 mg by mouth 2 (two) times daily with a meal.    . medroxyPROGESTERone (PROVERA) 10 MG tablet Take 10 mg by mouth daily.    . ferrous sulfate 325 (65 FE) MG tablet Take 1 tablet (325 mg total) by mouth 2 (two) times daily with a meal. (Patient not taking: Reported on 09/23/2018) 60 tablet 0   No current facility-administered medications for this visit.     PHYSICAL EXAMINATION: ECOG PERFORMANCE STATUS: 0 - Asymptomatic  Vitals:   09/23/18 1343  BP: 126/72  Pulse: 91  Resp: 18  Temp: 98.4 F (36.9 C)  SpO2: 99%   Filed Weights   09/23/18 1343  Weight: 176 lb 14.4 oz (80.2 kg)    GENERAL:alert, no distress and comfortable SKIN: skin color, texture, turgor are normal, no rashes or significant lesions EYES: normal, Conjunctiva are pink and non-injected, sclera clear  NECK: supple, thyroid normal size, non-tender, without nodularity LYMPH:  no palpable lymphadenopathy in the cervical, axillary  LUNGS: clear to auscultation and percussion with normal breathing effort HEART: regular rate & rhythm and no murmurs and no lower extremity edema ABDOMEN:abdomen soft, non-tender and normal bowel sounds Musculoskeletal:no cyanosis of digits and no clubbing  NEURO: alert & oriented x 3 with fluent speech, no focal motor/sensory deficits  LABORATORY DATA:  I have reviewed the data as listed CBC Latest Ref Rng & Units 09/16/2018 05/03/2018 01/26/2018  WBC 4.0 - 10.5 K/uL 5.8 5.7 8.0  Hemoglobin 12.0 - 15.0 g/dL 14.9 14.0 14.3  Hematocrit 36.0 - 46.0 % 43.6 41.1  43.4  Platelets 150 - 400 K/uL 274 259 272     CMP Latest Ref Rng & Units 12/02/2017 12/01/2017 07/17/2017  Glucose 70 - 99 mg/dL 87 90 98  BUN 6 - 20 mg/dL 15 09(W21(H) 12  Creatinine 0.44 - 1.00 mg/dL 1.190.53 1.470.90 8.290.56  Sodium 135 - 145 mmol/L 135 139 136  Potassium 3.5 - 5.1 mmol/L 3.9 4.0 4.3  Chloride 98 - 111 mmol/L 101 102 102  CO2 22 - 32 mmol/L 25 25 23   Calcium 8.9 - 10.3 mg/dL 8.1(L) 8.5(L) 9.0   Total Protein 6.0 - 8.3 g/dL - - -  Total Bilirubin 0.3 - 1.2 mg/dL - - -  Alkaline Phos 39 - 117 U/L - - -  AST 0 - 37 U/L - - -  ALT 0 - 35 U/L - - -      RADIOGRAPHIC STUDIES: I have personally reviewed the radiological images as listed and agreed with the findings in the report. No results found.   ASSESSMENT & PLAN:  Sara Beltran is a 43 y.o. female with   1. 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  -She has been on oral contraceptives, which is a high risk for thrombosis,this is probably provoked. -She has been on anticoagulation with Xarelto since 12/03/17.She is tolerating well.  -Repeat CT angio chest from 05/14/18 shows no residual or new blood clots. She previously stopped Xarelto.  -Repeat Hypercoagulation workup in 09/2018 was normal.  She plans to proceed with Uterine ablation soon and will stop provera afterward, to prevent another provoked blood clot.  -We again discussed other risk factors for thrombosis, smoking, birth control pills, immobility, cancer, etc.  I encouraged her to remain physically active -She may consider daily baby aspirin for prophylactic use.   -F/u as needed in the future   2. Moderate to severe symptomatic anemia,secondary toiron deficiencyfrom menorrhagia and possible poor absorption -Her Hospital labs from 12/02/17 showsferritin was 2, significantly elevated TIBC, transferrin saturation 8%, this is consistent with severe iron deficiency. -She received a blood transfusion and 2 doses of IV Iron in 12/2017  -She changed her birth control to Provera, no period since starting.  -She underwent uterine ablation with Gyn on 02/16/19 -Anemia and iron deficiency resolved since starting birth control with Provera, last period in 01/2018. She plans to have uterine ablation soon. -I advised her to stop birth control after endometrial ablation as this may slightly increases  her risk of blood clots. She understands.  -I discussed with no menorrhagia or regular period her anemia and iron deficeincy will be controlled. She will not need oral iron. Will follow up with labs   3. Dyspnea and chest pain, secondary to #1 and #2  -Worsened in the past week.  -05/14/18 CT angio chest was negative for residual blood clot. 05/2018 iron panal was normla.  -Will monitor  4. Attention Deficit Disorder -Previously on Adderall and sotpped for a year due to her PE and anemia  -She restarted Adderall 10mg  BID. Will continue to be managed by her PCP  PLAN: -lab reviewed, her hypercoagulopathy lab was unremarkable, no need long term anticoagulation therapy  She can consider Daily baby aspirin  -f/u as needed in the future    No problem-specific Assessment & Plan notes found for this encounter.   No orders of the defined types were placed in this encounter.  All questions were answered. The patient knows to call the clinic with  any problems, questions or concerns. No barriers to learning was detected. I spent 10 minutes counseling the patient face to face. The total time spent in the appointment was 15 minutes and more than 50% was on counseling and review of test results     Malachy MoodYan Jaliel Deavers, MD 09/23/2018   I, Delphina CahillAmoya Bennett, am acting as scribe for Malachy MoodYan Kerrion Kemppainen, MD.   I have reviewed the above documentation for accuracy and completeness, and I agree with the above.

## 2018-09-19 LAB — PROTEIN S ACTIVITY: Protein S Activity: 97 % (ref 63–140)

## 2018-09-19 LAB — PROTEIN S, ANTIGEN, FREE: Protein S Ag, Free: 86 % (ref 57–157)

## 2018-09-23 ENCOUNTER — Inpatient Hospital Stay (HOSPITAL_BASED_OUTPATIENT_CLINIC_OR_DEPARTMENT_OTHER): Payer: Medicaid Other | Admitting: Hematology

## 2018-09-23 ENCOUNTER — Encounter: Payer: Self-pay | Admitting: Hematology

## 2018-09-23 ENCOUNTER — Other Ambulatory Visit: Payer: Self-pay

## 2018-09-23 VITALS — BP 126/72 | HR 91 | Temp 98.4°F | Resp 18 | Ht 66.0 in | Wt 176.9 lb

## 2018-09-23 DIAGNOSIS — D509 Iron deficiency anemia, unspecified: Secondary | ICD-10-CM | POA: Diagnosis not present

## 2018-09-23 DIAGNOSIS — Z79899 Other long term (current) drug therapy: Secondary | ICD-10-CM

## 2018-09-23 DIAGNOSIS — R079 Chest pain, unspecified: Secondary | ICD-10-CM | POA: Diagnosis not present

## 2018-09-23 DIAGNOSIS — Z86711 Personal history of pulmonary embolism: Secondary | ICD-10-CM

## 2018-09-23 DIAGNOSIS — Z7901 Long term (current) use of anticoagulants: Secondary | ICD-10-CM | POA: Diagnosis not present

## 2018-09-23 DIAGNOSIS — R0600 Dyspnea, unspecified: Secondary | ICD-10-CM

## 2018-09-23 DIAGNOSIS — F988 Other specified behavioral and emotional disorders with onset usually occurring in childhood and adolescence: Secondary | ICD-10-CM

## 2018-09-24 ENCOUNTER — Telehealth: Payer: Self-pay | Admitting: Hematology

## 2018-09-24 NOTE — Telephone Encounter (Signed)
No los per 7/23. °

## 2019-12-25 IMAGING — CT CT ANGIOGRAPHY CHEST
2 of 6 series · 18 of 46 positions shown · IV contrast (OMNIPAQUE)
Comparison: CT angiogram chest December 01, 2017

CLINICAL DATA: Previous pulmonary embolism, status post therapy for
several months

EXAM:
CT ANGIOGRAPHY CHEST WITH CONTRAST
TECHNIQUE: Multidetector CT imaging of the chest was performed using the
standard protocol during bolus administration of intravenous
contrast. Multiplanar CT image reconstructions and MIPs were
obtained to evaluate the vascular anatomy.
CONTRAST:  100mL OMNIPAQUE IOHEXOL 350 MG/ML SOLN

[Series 5: thins · axial · 0.71mm/px · z∈[-300,-60]mm · 16 of 264 slices shown]
[im 12/264  lung]
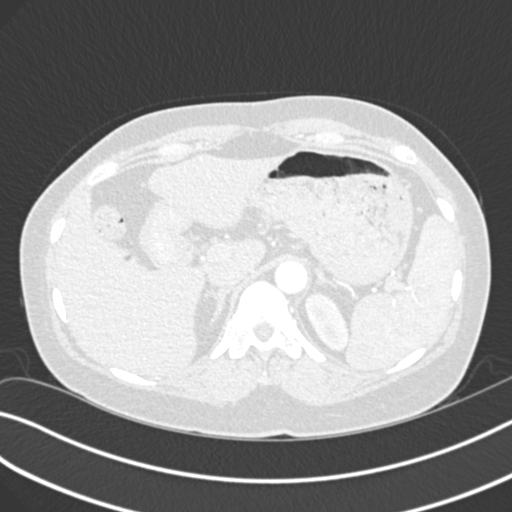
[im 35/264  soft-tissue]
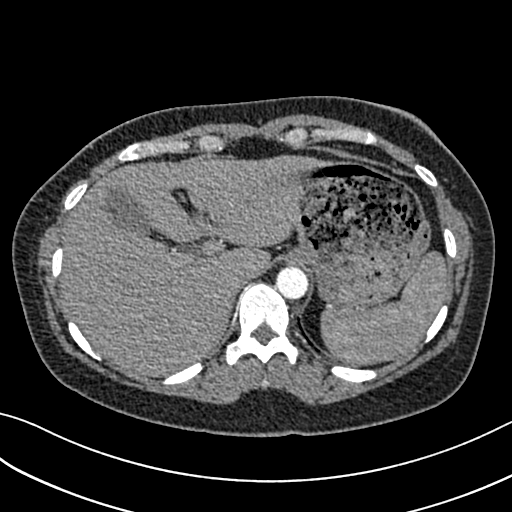
[im 46/264  lung]
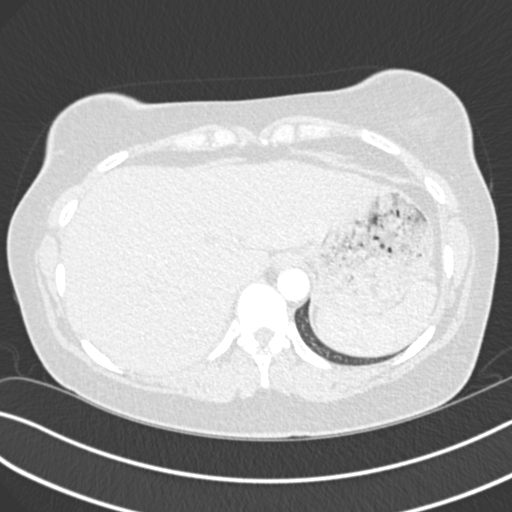
[im 58/264  soft-tissue]
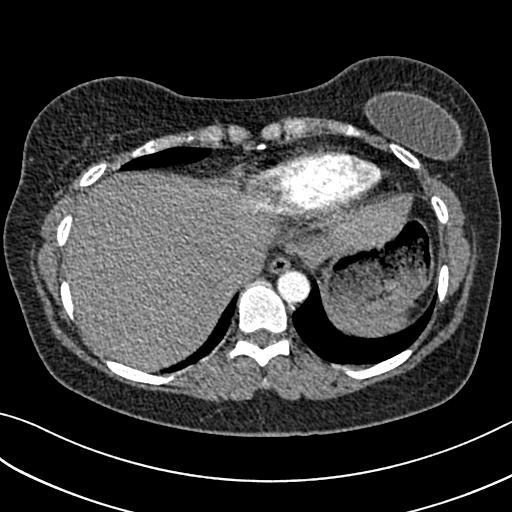
[im 81/264  lung]
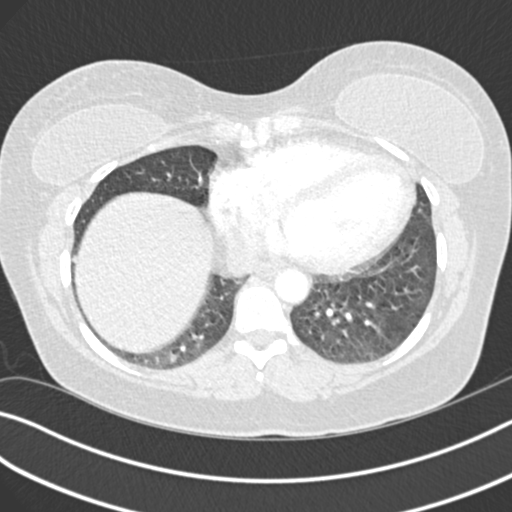
[im 92/264  soft-tissue]
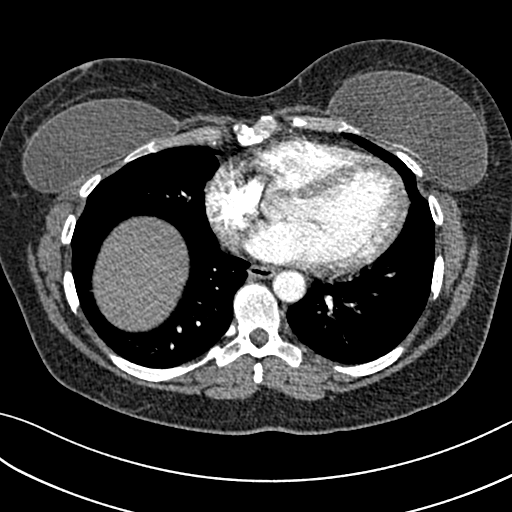
[im 103/264  lung]
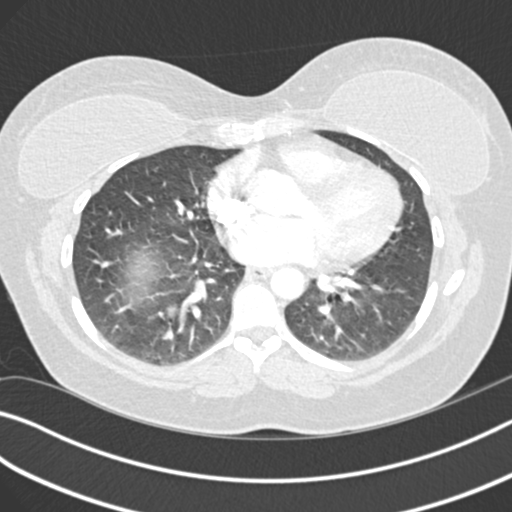
[im 126/264  soft-tissue]
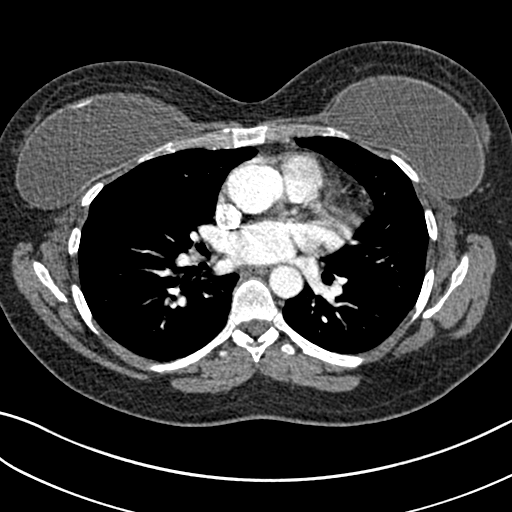
[im 138/264  lung]
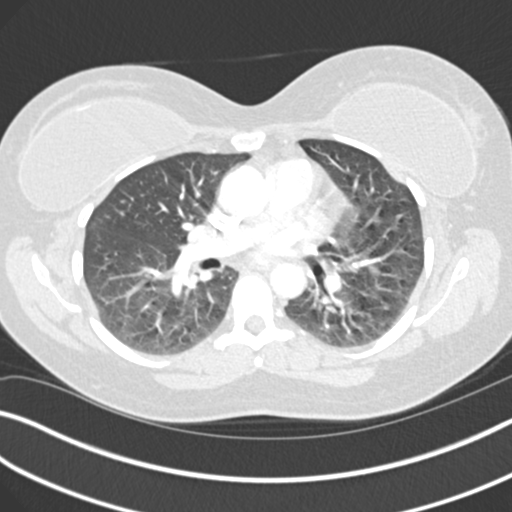
[im 161/264  soft-tissue]
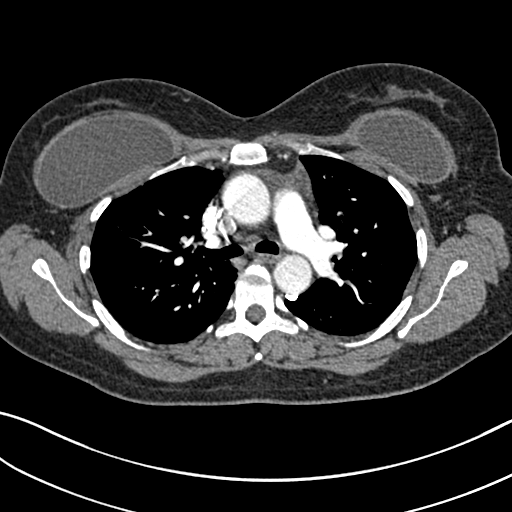
[im 172/264  lung]
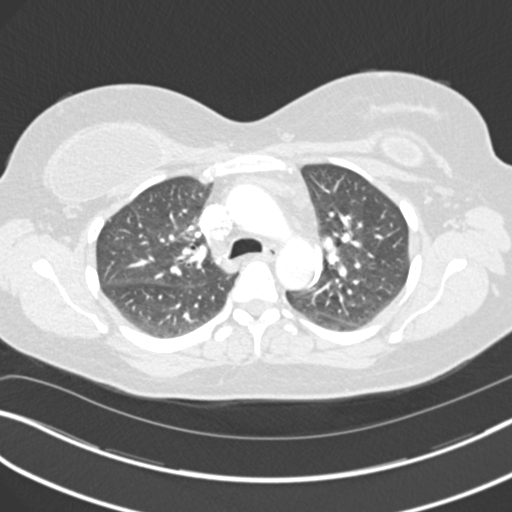
[im 183/264  soft-tissue]
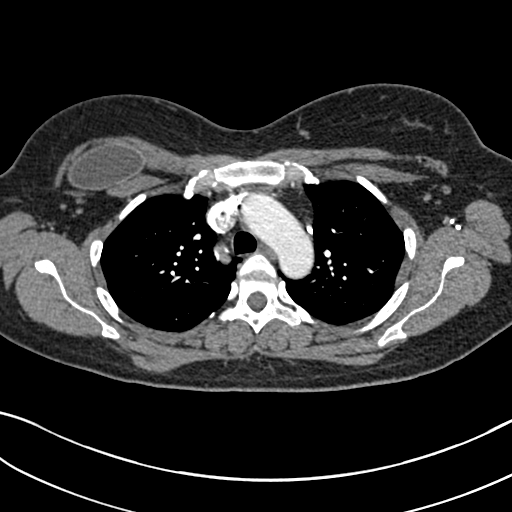
[im 206/264  lung]
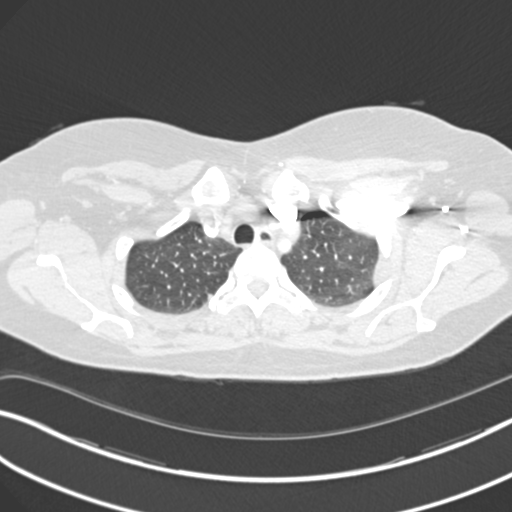
[im 218/264  soft-tissue]
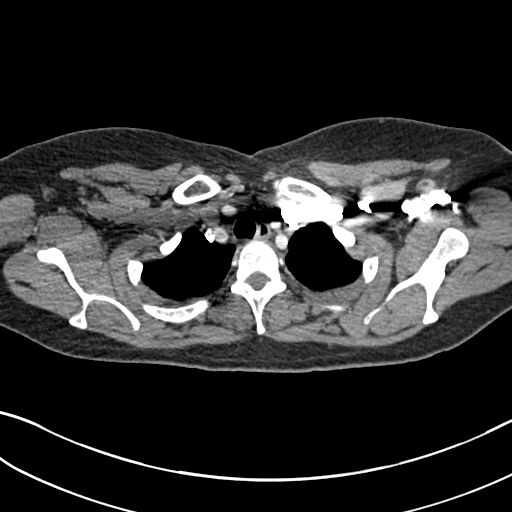
[im 229/264  lung]
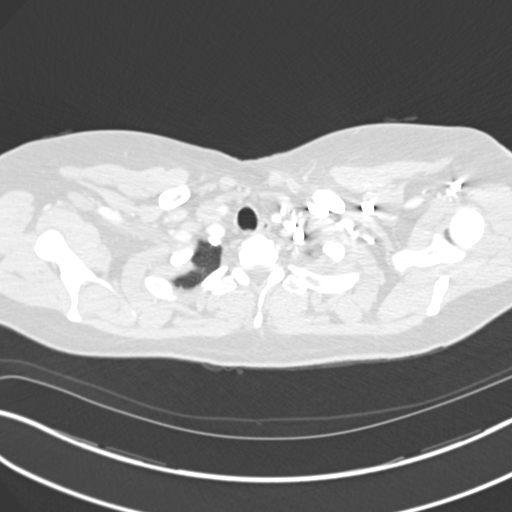
[im 252/264  soft-tissue]
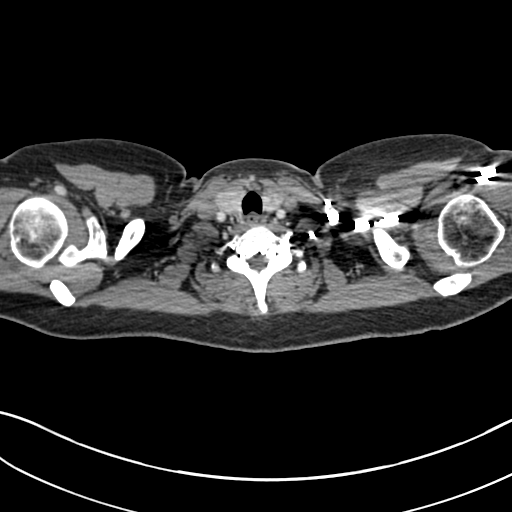

[Series 7: coronal mpr · coronal · 0.55mm/px · 2 of 78 slices shown]
[im 26/78  soft-tissue]
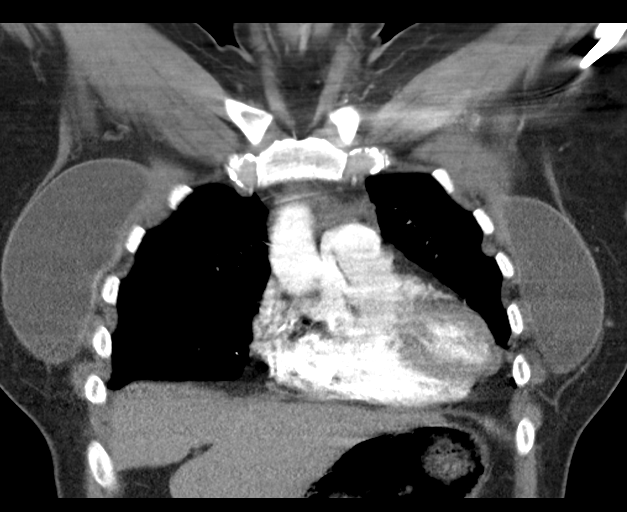
[im 52/78  soft-tissue]
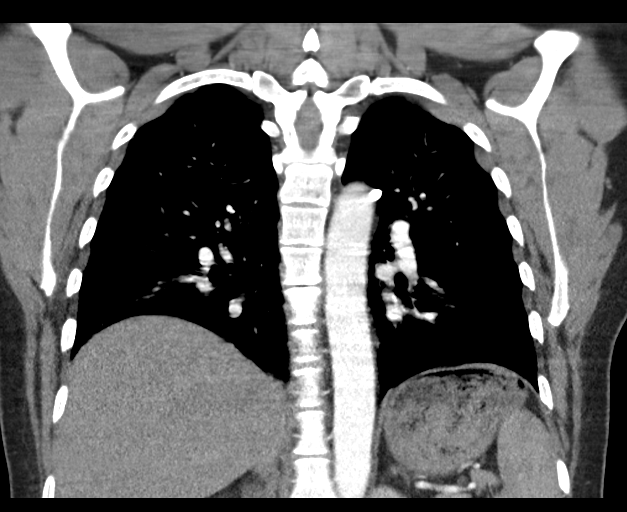

[18 of 46 positions shown; findings below may reference images not displayed]

FINDINGS: Cardiovascular: The somewhat web-like decreased attenuation in a
segmental left lower lobe pulmonary artery branch appears stable
compared to the prior study, likely mild sequela of previous
pulmonary embolus. There has no new or progression of pulmonary
embolus. There is no abdominal aortic aneurysm or dissection.
Visualized great vessels appear normal. No pericardial effusion or
pericardial thickening evident.

Mediastinum/Nodes: . visualized thyroid appears normal. There is no
appreciable thoracic adenopathy. No esophageal lesions are evident.

Lungs/Pleura: There is no edema or consolidation. There is slight
left base atelectasis. No pleural effusion or pleural thickening
evident.

Upper Abdomen: Visualized upper abdominal structures appear normal.

Musculoskeletal: There are breast implants bilaterally. There are no
blastic or lytic bone lesions. No evident chest wall lesion.

Review of the MIP images confirms the above findings.
IMPRESSION: 1. No demonstrable acute pulmonary embolus. Mild apparent
scarring/residual focus from prior pulmonary embolus in a left lower
lobe segmental pulmonary artery. This finding has not progressed
from previous study.

2.  No thoracic aortic aneurysm or dissection.

3.  Slight atelectasis left base.  No edema or consolidation.

4.  Appreciable thoracic adenopathy.
